# Patient Record
Sex: Female | Born: 1990 | Race: Black or African American | Hispanic: No | Marital: Single | Smoking: Never smoker
Health system: Southern US, Community
[De-identification: ages and names within clinical notes are randomized; demographics above are authoritative.]

## PROBLEM LIST (undated history)

## (undated) ENCOUNTER — Inpatient Hospital Stay (HOSPITAL_COMMUNITY): Payer: Self-pay

## (undated) ENCOUNTER — Inpatient Hospital Stay (HOSPITAL_COMMUNITY): Payer: BC Managed Care – PPO

## (undated) ENCOUNTER — Ambulatory Visit (HOSPITAL_COMMUNITY): Admission: EM | Payer: Medicaid Other

## (undated) DIAGNOSIS — Z8619 Personal history of other infectious and parasitic diseases: Secondary | ICD-10-CM

## (undated) DIAGNOSIS — F419 Anxiety disorder, unspecified: Secondary | ICD-10-CM

## (undated) DIAGNOSIS — Z789 Other specified health status: Secondary | ICD-10-CM

## (undated) HISTORY — PX: NO PAST SURGERIES: SHX2092

---
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
## 2012-03-27 ENCOUNTER — Ambulatory Visit: Payer: BC Managed Care – PPO | Admitting: Family Medicine

## 2012-04-11 ENCOUNTER — Ambulatory Visit: Payer: BC Managed Care – PPO

## 2012-04-14 ENCOUNTER — Encounter: Payer: Self-pay | Admitting: Family Medicine

## 2012-04-14 ENCOUNTER — Ambulatory Visit (INDEPENDENT_AMBULATORY_CARE_PROVIDER_SITE_OTHER): Payer: BC Managed Care – PPO | Admitting: Family Medicine

## 2012-04-14 VITALS — BP 118/76 | HR 102 | Temp 97.7°F | Ht 66.0 in | Wt 156.7 lb

## 2012-04-14 DIAGNOSIS — Z111 Encounter for screening for respiratory tuberculosis: Secondary | ICD-10-CM

## 2012-04-14 DIAGNOSIS — Z3009 Encounter for other general counseling and advice on contraception: Secondary | ICD-10-CM

## 2012-04-14 NOTE — Patient Instructions (Addendum)
[email or web address]  No sex or use condoms to prevent pregnancy before you come to get it placed  May take 600 mg ibuprofen 1 hour before your appointment

## 2012-04-14 NOTE — Progress Notes (Signed)
  Subjective:    Patient ID: Natasha Garcia, female    DOB: [birth year], 22 y.o.   MRN: [id number]  HPI    Here to discuss contraception  Unplanned pregnancy was on OCP- delivered via home birth October 2013  Has had unprotected sex.  Does not desire pregnancy for several years.  Would like to discuss options for long term birth control Review of Systems See HPI    Objective:   Physical Exam  GEN: NAD      Assessment & Plan:

## 2012-04-14 NOTE — Assessment & Plan Note (Signed)
Discussed iud, nexplanon and depo as options.  Patient would l ike to proceed with Mirena.  Discussed risks, procedure with model in room.  Also discussed small chance of early pregnancy if not abstaining or using contraception.  She decided abstinence would be her best option, will plan on placing the Mirena at her next convenience.

## 2012-04-16 ENCOUNTER — Ambulatory Visit (INDEPENDENT_AMBULATORY_CARE_PROVIDER_SITE_OTHER): Payer: BC Managed Care – PPO | Admitting: *Deleted

## 2012-04-16 DIAGNOSIS — Z111 Encounter for screening for respiratory tuberculosis: Secondary | ICD-10-CM

## 2012-04-16 LAB — TB SKIN TEST: TB Skin Test: NEGATIVE

## 2012-06-27 ENCOUNTER — Ambulatory Visit: Payer: BC Managed Care – PPO | Admitting: Family Medicine

## 2012-07-08 ENCOUNTER — Ambulatory Visit: Payer: BC Managed Care – PPO | Admitting: Family Medicine

## 2012-07-15 ENCOUNTER — Ambulatory Visit (INDEPENDENT_AMBULATORY_CARE_PROVIDER_SITE_OTHER): Payer: BC Managed Care – PPO | Admitting: Family Medicine

## 2012-07-15 ENCOUNTER — Encounter: Payer: Self-pay | Admitting: Family Medicine

## 2012-07-15 VITALS — BP 120/70 | HR 90 | Temp 98.3°F | Ht 66.0 in | Wt 152.0 lb

## 2012-07-15 DIAGNOSIS — Z2089 Contact with and (suspected) exposure to other communicable diseases: Secondary | ICD-10-CM

## 2012-07-15 DIAGNOSIS — Z30430 Encounter for insertion of intrauterine contraceptive device: Secondary | ICD-10-CM

## 2012-07-15 DIAGNOSIS — Z309 Encounter for contraceptive management, unspecified: Secondary | ICD-10-CM

## 2012-07-15 DIAGNOSIS — Z20828 Contact with and (suspected) exposure to other viral communicable diseases: Secondary | ICD-10-CM

## 2012-07-15 DIAGNOSIS — Z202 Contact with and (suspected) exposure to infections with a predominantly sexual mode of transmission: Secondary | ICD-10-CM

## 2012-07-15 LAB — RPR

## 2012-07-15 LAB — HIV ANTIBODY (ROUTINE TESTING W REFLEX): HIV: NONREACTIVE

## 2012-07-15 MED ORDER — IBUPROFEN 200 MG PO TABS
800.0000 mg | ORAL_TABLET | Freq: Once | ORAL | Status: AC
  Administered 2012-07-15: 800 mg via ORAL

## 2012-07-15 NOTE — Progress Notes (Signed)
  Subjective:    Patient ID: Natasha Garcia, female    DOB: 02-12-1991, 22 y.o.   MRN: [id number]  HPIHere to have Mirena inserted.  Not quite sure if she would like mirena or nexplanon.  Discussed pros and cons of each.  Also desires HIV testing  LMP 06/28/12.  Has not had intercourse since that time.  upreg negative today.  No concerns for vaginal discharge, pelvic infection.  Review of Systems     Objective:   Physical Exam Placement of Mirena IUD  Indication: Contraception Assisted WU:JWJXB Kathrine Cords   Patient was counseled regarding the risks/benefits/alternatives of the IUD. Urine pregnancy test negative . Consent was obtained and all questions answered.  Time out performed.  Description: Cervix was swabbed three times with Betadine swabs. Sterile gloves donned. Sterile single-tooth tenaculum used to grasp anterior lip of the cervix and straighten the endocervical canal. Uterus sounded to 8 cm. IUD loaded per manufacturer's instruction and flange set to 8 cm. IUD placed per manufacturer's directions. Strings trimmed to 3 cm. Patient tolerated the procedure well.    Follow-up: Patient counseled regarding techniques for self-monitoring of IUD and given precautions. Patient notified of removal date and given card. Patient will follow-up in 4 weeks for string check.        Assessment & Plan:

## 2012-07-15 NOTE — Patient Instructions (Addendum)
IUD AFTERCARE  1.  Uterine cramping is common after IUD placement.  You  May using heating pads, ibuprofen, and tylenol.  If it becomes very painful, you notice fever or chills, unusual bleeding, or foul smelling vaginal discharge please call the office. 2.  Irregular vaginal bleeding is common in the first few months after IUD placement but will often get better in the first six months.   3.  IUDs do not protect against STD such as HIV, genital warts, gonorrhea, chlamydia, herpes.  Please continue to protect yourself against these infections and contact the office if you think you may have contracted an infection. 4.  Checking for proper placement:  You may feel for your IUD strings as shown today by placing you fingers into your vagina.  Do not pull on the strings.  If your Mirena falls out, you can feel the hard plastic part of the IUD, or you can no longer feel the strings, please contact the office. 5.  Your Mirena should be removed or replaced in 5 years. 6.  Please see package insert or [email or web address] for full patient information. 7.  Make follow-up appointment for 4 weeks.  Use backup protection for 1 week

## 2012-07-15 NOTE — Addendum Note (Signed)
Addended by: Jennette Bill on: 07/15/2012 05:28 PM   Modules accepted: Orders

## 2012-07-16 ENCOUNTER — Encounter: Payer: Self-pay | Admitting: Family Medicine

## 2012-07-16 MED ORDER — LEVONORGESTREL 20 MCG/24HR IU IUD: INTRAUTERINE_SYSTEM | Freq: Once | INTRAUTERINE | Status: DC

## 2012-07-16 MED ORDER — LEVONORGESTREL 20 MCG/24HR IU IUD
INTRAUTERINE_SYSTEM | Freq: Once | INTRAUTERINE | Status: AC
  Administered 2012-07-15: 17:00:00 via INTRAUTERINE

## 2012-07-16 NOTE — Addendum Note (Signed)
Addended by: Jone Baseman D on: 07/16/2012 08:47 AM   Modules accepted: Orders

## 2012-11-25 ENCOUNTER — Ambulatory Visit: Payer: BC Managed Care – PPO | Admitting: Family Medicine

## 2012-11-27 ENCOUNTER — Ambulatory Visit: Payer: BC Managed Care – PPO | Admitting: Family Medicine

## 2012-12-14 ENCOUNTER — Emergency Department (HOSPITAL_COMMUNITY)
Admission: EM | Admit: 2012-12-14 | Discharge: 2012-12-14 | Disposition: A | Discharge status: Home / Self Care | Payer: BC Managed Care – PPO | Source: Home / Self Care

## 2012-12-14 ENCOUNTER — Encounter (HOSPITAL_COMMUNITY): Payer: Self-pay

## 2012-12-14 DIAGNOSIS — B356 Tinea cruris: Secondary | ICD-10-CM

## 2012-12-14 MED ORDER — NYSTATIN 100000 UNIT/GM EX POWD: Freq: Four times a day (QID) | CUTANEOUS | Status: DC

## 2012-12-14 MED ORDER — CLOTRIMAZOLE-BETAMETHASONE 1-0.05 % EX CREA: TOPICAL_CREAM | CUTANEOUS | Status: DC

## 2012-12-14 NOTE — ED Notes (Signed)
C/o rash lower abdominal area and groin x 3 weeks; no one else in household has rash

## 2012-12-14 NOTE — ED Provider Notes (Signed)
CSN: [id number]     Arrival date & time 12/14/12  1337 History   None    Chief Complaint  Patient presents with  . Rash   (Consider location/radiation/quality/duration/timing/severity/associated sxs/prior Treatment) HPI Comments: 22 year old female complaining of a rash for one month. It is located on the proximal medial thighs and along the inguinal creases.   Past Medical History  Diagnosis Date  . No pertinent past medical history    Past Surgical History  Procedure Laterality Date  . No past surgeries     Family History  Problem Relation Age of Onset  . Cancer Maternal Grandmother   . Diabetes Neg Hx   . Heart disease Neg Hx   . Stroke Neg Hx   . Anesthesia problems Neg Hx    History  Substance Use Topics  . Smoking status: Never Smoker   . Smokeless tobacco: Never Used  . Alcohol Use: No   OB History   Grav Para Term Preterm Abortions TAB SAB Ect Mult Living   1              Review of Systems  Skin: Positive for rash.  All other systems reviewed and are negative.    Allergies  Review of patient's allergies indicates no known allergies.  Home Medications   Current Outpatient Rx  Name  Route  Sig  Dispense  Refill  . levonorgestrel (MIRENA) 20 MCG/24HR IUD   Intrauterine   1 each by Intrauterine route once. Inserted 07/15/12         . clotrimazole-betamethasone (LOTRISONE) cream      Apply to affected area 2 times daily prn   30 g   0   . nystatin (MYCOSTATIN) powder   Topical   Apply topically 4 (four) times daily.   30 g   0   . Prenatal Vit-Fe Fumarate-FA (PRENATAL MULTIVITAMIN) TABS   Oral   Take 1 tablet by mouth daily.          BP 108/80  Pulse 76  Temp(Src) 98.2 F (36.8 C) (Oral)  Resp 10  LMP 12/13/2012  Breastfeeding? Unknown Physical Exam  Nursing note and vitals reviewed. Constitutional: She is oriented to person, place, and time. She appears well-developed and well-nourished. No distress.  Eyes: EOM are normal.  Neck:  Normal range of motion. Neck supple.  Cardiovascular: Normal rate.   Pulmonary/Chest: Effort normal. No respiratory distress.  Musculoskeletal: She exhibits no edema.  Neurological: She is alert and oriented to person, place, and time.  Skin: Skin is warm and dry. Rash noted.  Slightly raised, avoid and partially scaly rough lesions along the bilateral inguinal folds and proximal medial thighs. When working sweating these areas burn. No drainage or evidence of bacterial infection.  Psychiatric: She has a normal mood and affect.    ED Course  Procedures (including critical care time) Labs Review Labs Reviewed - No data to display Imaging Review No results found.  MDM   1. Tinea cruris      Keep area dry as much as possible Nystatin powder 3-4 times a day Lotrisone cream twice a day to affected area.  Hayden Rasmussen, NP 12/14/12 1444

## 2012-12-14 NOTE — ED Provider Notes (Signed)
Medical screening examination/treatment/procedure(s) were performed by resident physician or non-physician practitioner and as supervising physician I was immediately available for consultation/collaboration.   Barkley Bruns MD.   Linna Hoff, MD 12/14/12 [phone]

## 2012-12-26 ENCOUNTER — Telehealth (HOSPITAL_COMMUNITY): Payer: Self-pay | Admitting: Emergency Medicine

## 2012-12-26 NOTE — ED Notes (Signed)
Patient reports running out of cream, spoke to [employer], np.  Instructed patient to use lotrimin or lamisil otc.  Patient states understanding

## 2013-01-01 ENCOUNTER — Ambulatory Visit (INDEPENDENT_AMBULATORY_CARE_PROVIDER_SITE_OTHER): Payer: BC Managed Care – PPO | Admitting: Family Medicine

## 2013-01-01 VITALS — BP 129/83 | HR 80 | Temp 98.8°F | Wt 134.0 lb

## 2013-01-01 DIAGNOSIS — L210 Seborrhea capitis: Secondary | ICD-10-CM | POA: Insufficient documentation

## 2013-01-01 DIAGNOSIS — R21 Rash and other nonspecific skin eruption: Secondary | ICD-10-CM

## 2013-01-01 MED ORDER — TRIAMCINOLONE ACETONIDE 0.1 % EX CREA: TOPICAL_CREAM | Freq: Two times a day (BID) | CUTANEOUS | Status: DC

## 2013-01-01 MED ORDER — PREDNISONE 50 MG PO TABS: 50.0000 mg | ORAL_TABLET | Freq: Every day | ORAL | Status: DC

## 2013-01-01 NOTE — Assessment & Plan Note (Signed)
Patient seen and examined by Dr. Mauricio Po. KOH skin scraping negative for fungus. D/c antifungals. Distribution and story very consistent with pityriasis. Will treat with Prednisone 50mg  x5 days for itching and redness. Use Triamcinolone prn for itching as well. Expect it to resolve spontaneously, f/u as needed.

## 2013-01-01 NOTE — Patient Instructions (Addendum)

## 2013-01-01 NOTE — Progress Notes (Signed)
Patient ID: Natasha Garcia, female   DOB: 01-10-1991, 22 y.o.   MRN: [id number]  [facility] Amber M. Hairford, MD Phone: [phone]   Subjective: HPI: Patient is a 22 y.o. female presenting to clinic today for same day follow up appointment for skin rash.   She was seen at Urgent Care and diagnosed with fungal infection. She used topical cream and powder which helped some initially, but once she ran out of the cream, the rash became more red and itchy again. She states it never fully went away and is now getting worse/spreading. Initially she had 2-3 areas on her arms, then she noticed the rash in her groin/thighs. She states that has been there for 1 month. It is red, peeling and itchy. She has recently noticed more red areas on her back and upper chest as well. She is not using any new medications, soaps, lotions, detergents.   History Reviewed: Non smoker. Health Maintenance: UTD  ROS: Please see HPI above.  Objective: Office vital signs reviewed. BP 129/83  Pulse 80  Temp(Src) 98.8 F (37.1 C) (Oral)  Wt 134 lb (60.782 kg)  BMI 21.64 kg/m2  LMP 12/13/2012  Physical Examination:  General: Awake, alert. NAD HEENT: Atraumatic, normocephalic. MMM. Abdomen:+BS, soft, nontender, nondistended Extremities: No edema Skin: hyperpigmented discrete lesions on thighs, inguinal folds and upper thighs. Some more erythematous than others, and some with dry central patches. Also has discrete erythematous macules on back.  Assessment: 22 y.o. female with skin rash, most likely pityriasis   Plan: See Problem List and After Visit Summary

## 2013-03-23 ENCOUNTER — Telehealth: Payer: Self-pay | Admitting: Family Medicine

## 2013-03-23 ENCOUNTER — Other Ambulatory Visit: Payer: Self-pay | Admitting: Family Medicine

## 2013-03-23 MED ORDER — TRIAMCINOLONE ACETONIDE 0.1 % EX CREA: TOPICAL_CREAM | Freq: Two times a day (BID) | CUTANEOUS | Status: DC

## 2013-03-23 NOTE — Telephone Encounter (Signed)
Pt is calling for a refill on her kenalog sent to her pharmacy. jw

## 2013-03-23 NOTE — Telephone Encounter (Signed)
Refill sent in

## 2013-05-22 ENCOUNTER — Telehealth: Payer: Self-pay | Admitting: Family Medicine

## 2013-05-22 MED ORDER — TRIAMCINOLONE ACETONIDE 0.1 % EX CREA: TOPICAL_CREAM | Freq: Two times a day (BID) | CUTANEOUS | Status: DC

## 2013-05-22 NOTE — Telephone Encounter (Signed)
Refill request for Kenalog. Patient states she would like a larger tube or multiple tubes of cream called in because she goes through it rather fast. [phone]

## 2013-07-04 ENCOUNTER — Encounter (HOSPITAL_COMMUNITY): Payer: Self-pay | Admitting: Emergency Medicine

## 2013-07-04 ENCOUNTER — Emergency Department (HOSPITAL_COMMUNITY)
Admission: EM | Admit: 2013-07-04 | Discharge: 2013-07-04 | Disposition: A | Discharge status: Home / Self Care | Payer: BC Managed Care – PPO | Source: Home / Self Care | Attending: Emergency Medicine | Admitting: Emergency Medicine

## 2013-07-04 DIAGNOSIS — K529 Noninfective gastroenteritis and colitis, unspecified: Secondary | ICD-10-CM

## 2013-07-04 DIAGNOSIS — K5289 Other specified noninfective gastroenteritis and colitis: Secondary | ICD-10-CM

## 2013-07-04 LAB — POCT URINALYSIS DIP (DEVICE)
BILIRUBIN URINE: NEGATIVE
Glucose, UA: NEGATIVE mg/dL
Hgb urine dipstick: NEGATIVE
KETONES UR: NEGATIVE mg/dL
Leukocytes, UA: NEGATIVE
Nitrite: NEGATIVE
PH: 7 (ref 5.0–8.0)
Protein, ur: NEGATIVE mg/dL
Specific Gravity, Urine: 1.025 (ref 1.005–1.030)
Urobilinogen, UA: 0.2 mg/dL (ref 0.0–1.0)

## 2013-07-04 LAB — POCT PREGNANCY, URINE: PREG TEST UR: NEGATIVE

## 2013-07-04 MED ORDER — ONDANSETRON 4 MG PO TBDP
4.0000 mg | ORAL_TABLET | Freq: Once | ORAL | Status: AC
  Administered 2013-07-04: 4 mg via ORAL

## 2013-07-04 MED ORDER — ONDANSETRON HCL 4 MG PO TABS: 4.0000 mg | ORAL_TABLET | Freq: Three times a day (TID) | ORAL | Status: DC | PRN

## 2013-07-04 MED ORDER — ONDANSETRON 4 MG PO TBDP
ORAL_TABLET | ORAL | Status: AC
  Filled 2013-07-04: qty 1

## 2013-07-04 NOTE — ED Notes (Signed)
C/o abd pain  States she thinks she has a stomach virus due to vomiting, chills and diarrhea Has vomit 8 times today and diarrhea once today

## 2013-07-04 NOTE — ED Provider Notes (Signed)
Medical screening examination/treatment/procedure(s) were performed by non-physician practitioner and as supervising physician I was immediately available for consultation/collaboration.  Leslee Homeavid Donell Tomkins, M.D.  Reuben Likesavid C Makesha Belitz, MD 07/04/13 2149

## 2013-07-04 NOTE — ED Provider Notes (Signed)
CSN: [id number]     Arrival date & time 07/04/13  1819 History   First MD Initiated Contact with Patient 07/04/13 1918     Chief Complaint  Patient presents with  . Abdominal Pain   (Consider location/radiation/quality/duration/timing/severity/associated sxs/prior Treatment) HPI Comments: Non-bloody emesis, non-bloody diarrhea  Patient is a 23 y.o. female presenting with vomiting.  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 No pertinent past medical history    Past Surgical History  Procedure Laterality Date  . No past surgeries     Family History  Problem Relation Age of Onset  . Cancer Maternal Grandmother   . Diabetes Neg Hx   . Heart disease Neg Hx   . Stroke Neg Hx   . Anesthesia problems Neg Hx    History  Substance Use Topics  . Smoking status: Never Smoker   . Smokeless tobacco: Never Used  . Alcohol Use: No   OB History   Grav Para Term Preterm Abortions TAB SAB Ect Mult Living   1              Review of Systems  Constitutional: Negative for fever, chills and fatigue.  HENT: Negative.  Negative for sore throat.   Eyes: Negative.   Respiratory: Negative.   Cardiovascular: Negative.   Gastrointestinal: Positive for nausea, vomiting and diarrhea. Negative for abdominal pain.  Endocrine: Negative for polydipsia, polyphagia and polyuria.  Genitourinary: Negative.   Musculoskeletal: Negative for arthralgias and myalgias.  Skin: Negative.   Neurological: Negative for  dizziness and headaches.    Allergies  Review of patient's allergies indicates no known allergies.  Home Medications   Current Outpatient Rx  Name  Route  Sig  Dispense  Refill  . levonorgestrel (MIRENA) 20 MCG/24HR IUD   Intrauterine   1 each by Intrauterine route once. Inserted 07/15/12         . ondansetron (ZOFRAN) 4 MG tablet   Oral   Take 1 tablet (4 mg total) by mouth every 8 (eight) hours as needed for nausea or vomiting.   15 tablet   0   . predniSONE (DELTASONE) 50 MG tablet   Oral   Take 1 tablet (50 mg total) by mouth daily.   5 tablet   0   . Prenatal Vit-Fe Fumarate-FA (PRENATAL MULTIVITAMIN) TABS   Oral   Take 1 tablet by mouth daily.         Marland Kitchen triamcinolone cream (KENALOG) 0.1 %   Topical   Apply topically 2 (two) times daily.   2268 g   1    BP 125/77  Pulse 88  Temp(Src) 98.1 F (36.7 C) (Oral)  Resp 19  SpO2 98%  LMP 05/24/2013 Physical Exam  Nursing note and vitals reviewed. Constitutional: She is oriented to person, place, and time. She appears well-developed and well-nourished.  HENT:  Head: Normocephalic and atraumatic.  Eyes: Conjunctivae are normal. No scleral icterus.  Cardiovascular: Normal rate, regular rhythm and normal heart sounds.   Pulmonary/Chest:  Effort normal and breath sounds normal.  Abdominal: Soft. Bowel sounds are normal. She exhibits no distension and no mass. There is no tenderness. There is no rebound and no guarding.  Musculoskeletal: Normal range of motion.  Neurological: She is alert and oriented to person, place, and time.  Skin: Skin is warm and dry. No rash noted.  Psychiatric: She has a normal mood and affect. Her behavior is normal.    ED Course  Procedures (including critical care time) Labs Review Labs Reviewed  POCT URINALYSIS DIP (DEVICE)  POCT PREGNANCY, URINE   Imaging Review No results found.   MDM   1. Gastroenteritis    Zofran as prescribed. Clear liquid diet for the next 8-12  hours. Then advance diet as tolerated. Red flag reasons for return reviewed with patient. Tylenol or ibuprofen as directed on packing for mild abdominal cramping. UA normal. UPT negative.   Jess BartersJennifer Lee SmithlandPresson, [place]PA 07/04/13 2031

## 2013-10-24 ENCOUNTER — Encounter (HOSPITAL_COMMUNITY): Payer: Self-pay | Admitting: Emergency Medicine

## 2013-10-24 ENCOUNTER — Emergency Department (HOSPITAL_COMMUNITY)
Admission: EM | Admit: 2013-10-24 | Discharge: 2013-10-24 | Disposition: A | Discharge status: Home / Self Care | Payer: BC Managed Care – PPO | Source: Home / Self Care | Attending: Emergency Medicine | Admitting: Emergency Medicine

## 2013-10-24 DIAGNOSIS — R599 Enlarged lymph nodes, unspecified: Secondary | ICD-10-CM

## 2013-10-24 DIAGNOSIS — R590 Localized enlarged lymph nodes: Secondary | ICD-10-CM

## 2013-10-24 DIAGNOSIS — R112 Nausea with vomiting, unspecified: Secondary | ICD-10-CM

## 2013-10-24 DIAGNOSIS — F1010 Alcohol abuse, uncomplicated: Secondary | ICD-10-CM

## 2013-10-24 DIAGNOSIS — M549 Dorsalgia, unspecified: Secondary | ICD-10-CM

## 2013-10-24 LAB — POCT URINALYSIS DIP (DEVICE)
Bilirubin Urine: NEGATIVE
Glucose, UA: NEGATIVE mg/dL
Hgb urine dipstick: NEGATIVE
KETONES UR: NEGATIVE mg/dL
LEUKOCYTES UA: NEGATIVE
Nitrite: NEGATIVE
PH: 7 (ref 5.0–8.0)
Protein, ur: 30 mg/dL — AB
Specific Gravity, Urine: 1.02 (ref 1.005–1.030)
Urobilinogen, UA: 0.2 mg/dL (ref 0.0–1.0)

## 2013-10-24 LAB — POCT PREGNANCY, URINE: Preg Test, Ur: NEGATIVE

## 2013-10-24 NOTE — ED Notes (Signed)
Pt reports getting drunk last night; has been vomiting today - had to leave work early, needs a note.  Also "while I'm here" c/o "lump" to right side base of neck, and ongoing back pain for several months that is relieved by 800mg  Motrin or 1g Tyl.

## 2013-10-24 NOTE — ED Provider Notes (Signed)
CSN: [id number]     Arrival date & time 10/24/13  1718 History   First MD Initiated Contact with Patient 10/24/13 1759     Chief Complaint  Patient presents with  . Emesis   (Consider location/radiation/quality/duration/timing/severity/associated sxs/prior Treatment) HPI Comments: 23 year old female presents requesting a note that she may return to work. She went out last night and got extremely drunk and was vomiting at work today so she was sent home. She is done vomiting, she feels okay now. No more nausea. She is going down liquids without difficulty. No abdominal pain. Also she reports a small lump in the right side of her neck, she states that she had really rough sex last night and wonders if that could have caused the lump in her neck. Also she complains of occasional back pain after a long day of work in her lower back. Right now, she is asymptomatic except for a slight headache from a hangover.  Patient is a 23 y.o. female presenting with vomiting.  Emesis Associated symptoms: no abdominal pain, no chills and no diarrhea     Past Medical History  Diagnosis Date  . No pertinent past medical history    Past Surgical History  Procedure Laterality Date  . No past surgeries     Family History  Problem Relation Age of Onset  . Cancer Maternal Grandmother   . Diabetes Neg Hx   . Heart disease Neg Hx   . Stroke Neg Hx   . Anesthesia problems Neg Hx    History  Substance Use Topics  . Smoking status: Never Smoker   . Smokeless tobacco: Never Used  . Alcohol Use: No     Comment: not normally   OB History   Grav Para Term Preterm Abortions TAB SAB Ect Mult Living   1              Review of Systems  Constitutional: Negative for fever and chills.  Gastrointestinal: Positive for nausea and vomiting. Negative for abdominal pain, diarrhea and constipation.  Musculoskeletal: Positive for back pain.  Hematological: Positive for adenopathy.  All other systems reviewed and are  negative.   Allergies  Review of patient's allergies indicates no known allergies.  Home Medications   Prior to Admission medications   Medication Sig Start Date End Date Taking? Authorizing Provider  levonorgestrel (MIRENA) 20 MCG/24HR IUD 1 each by Intrauterine route once. Inserted 07/15/12   Yes Historical Provider, MD  ondansetron (ZOFRAN) 4 MG tablet Take 1 tablet (4 mg total) by mouth every 8 (eight) hours as needed for nausea or vomiting. 07/04/13   Ardis RowanJennifer Lee Presson, PA  predniSONE (DELTASONE) 50 MG tablet Take 1 tablet (50 mg total) by mouth daily. 01/01/13   Amber Nydia BoutonM Hairford, MD  Prenatal Vit-Fe Fumarate-FA (PRENATAL MULTIVITAMIN) TABS Take 1 tablet by mouth daily.    Historical Provider, MD  triamcinolone cream (KENALOG) 0.1 % Apply topically 2 (two) times daily. 05/22/13   Josalyn C Funches, MD   BP 98/68  Pulse 80  Temp(Src) 99.4 F (37.4 C) (Oral)  Resp 16  SpO2 100% Physical Exam  Nursing note and vitals reviewed. Constitutional: She is oriented to person, place, and time. Vital signs are normal. She appears well-developed and well-nourished. No distress.  HENT:  Head: Normocephalic and atraumatic.  Pulmonary/Chest: Effort normal. No respiratory distress.  Abdominal: Soft. There is no tenderness.  Lymphadenopathy:    She has cervical adenopathy (right superficial cervical adenopathy, shotty, nontender).  Neurological: She is  alert and oriented to person, place, and time. She has normal strength. Coordination normal.  Skin: Skin is warm and dry. No rash noted. She is not diaphoretic.  Psychiatric: She has a normal mood and affect. Judgment normal.    ED Course  Procedures (including critical care time) Labs Review Labs Reviewed  POCT URINALYSIS DIP (DEVICE) - Abnormal; Notable for the following:    Protein, ur 30 (*)    All other components within normal limits  POCT PREGNANCY, URINE    Imaging Review No results found.   MDM   1. Non-intractable  vomiting with nausea, vomiting of unspecified type   2. Alcohol abuse   3. Cervical lymphadenopathy   4. Back pain, unspecified location    She declines any treatment, she just wants a note to say that she may return to work tomorrow. Work note provided. She declines a nausea medicine. No treatment required for the single lymph node, watchful waiting at this point. Followup as needed    Graylon Good, PA-C 10/24/13 1816

## 2013-10-24 NOTE — Discharge Instructions (Signed)
Alcohol Problems °Most adults who drink alcohol drink in moderation (not a lot) are at low risk for developing problems related to their drinking. However, all drinkers, including low-risk drinkers, should know about the health risks connected with drinking alcohol. °RECOMMENDATIONS FOR LOW-RISK DRINKING  °Drink in moderation. Moderate drinking is defined as follows:  °· Men - no more than 2 drinks per day. °· Nonpregnant women - no more than 1 drink per day. °· Over age 65 - no more than 1 drink per day. °A standard drink is 12 grams of pure alcohol, which is equal to a 12 ounce bottle of beer or wine cooler, a 5 ounce glass of wine, or 1.5 ounces of distilled spirits (such as whiskey, brandy, vodka, or rum).  °ABSTAIN FROM (DO NOT DRINK) ALCOHOL: °· When pregnant or considering pregnancy. °· When taking a medication that interacts with alcohol. °· If you are alcohol dependent. °· A medical condition that prohibits drinking alcohol (such as ulcer, liver disease, or heart disease). °DISCUSS WITH YOUR CAREGIVER: °· If you are at risk for coronary heart disease, discuss the potential benefits and risks of alcohol use: Light to moderate drinking is associated with lower rates of coronary heart disease in certain populations (for example, men over age 45 and postmenopausal women). Infrequent or nondrinkers are advised not to begin light to moderate drinking to reduce the risk of coronary heart disease so as to avoid creating an alcohol-related problem. Similar protective effects can likely be gained through proper diet and exercise. °· Women and the elderly have smaller amounts of body water than men. As a result women and the elderly achieve a higher blood alcohol concentration after drinking the same amount of alcohol. °· Exposing a fetus to alcohol can cause a broad range of birth defects referred to as Fetal Alcohol Syndrome (FAS) or Alcohol-Related Birth Defects (ARBD). Although FAS/ARBD is connected with excessive  alcohol consumption during pregnancy, studies also have reported neurobehavioral problems in infants born to mothers reporting drinking an average of 1 drink per day during pregnancy. °· Heavier drinking (the consumption of more than 4 drinks per occasion by men and more than 3 drinks per occasion by women) impairs learning (cognitive) and psychomotor functions and increases the risk of alcohol-related problems, including accidents and injuries. °CAGE QUESTIONS:  °· Have you ever felt that you should Cut down on your drinking? °· Have people Annoyed you by criticizing your drinking? °· Have you ever felt bad or Guilty about your drinking? °· Have you ever had a drink first thing in the morning to steady your nerves or get rid of a hangover (Eye opener)? °If you answered positively to any of these questions: You may be at risk for alcohol-related problems if alcohol consumption is:  °· Men: Greater than 14 drinks per week or more than 4 drinks per occasion. °· Women: Greater than 7 drinks per week or more than 3 drinks per occasion. °Do you or your family have a medical history of alcohol-related problems, such as: °· Blackouts. °· Sexual dysfunction. °· Depression. °· Trauma. °· Liver dysfunction. °· Sleep disorders. °· Hypertension. °· Chronic abdominal pain. °· Has your drinking ever caused you problems, such as problems with your family, problems with your work (or school) performance, or accidents/injuries? °· Do you have a compulsion to drink or a preoccupation with drinking? °· Do you have poor control or are you unable to stop drinking once you have started? °· Do you have to drink to   avoid withdrawal symptoms?  Do you have problems with withdrawal such as tremors, nausea, sweats, or mood disturbances?  Does it take more alcohol than in the past to get you high?  Do you feel a strong urge to drink?  Do you change your plans so that you can have a drink?  Do you ever drink in the morning to relieve  the shakes or a hangover? If you have answered a number of the previous questions positively, it may be time for you to talk to your caregivers, family, and friends and see if they think you have a problem. Alcoholism is a chemical dependency that keeps getting worse and will eventually destroy your health and relationships. Many alcoholics end up dead, impoverished, or in prison. This is often the end result of all chemical dependency.  Do not be discouraged if you are not ready to take action immediately.  Decisions to change behavior often involve up and down desires to change and feeling like you cannot decide.  Try to think more seriously about your drinking behavior.  Think of the reasons to quit. WHERE TO GO FOR ADDITIONAL INFORMATION   The National Institute on Alcohol Abuse and Alcoholism (NIAAA) BasicStudents.dkwww.niaaa.nih.gov  [employer] on Alcoholism and Drug Dependence (NCADD) www.ncadd.org  American Society of Addiction Medicine (ASAM) RoyalDiary.glwww.asam.org  Document Released: 03/26/2005 Document Revised: 06/18/2011 Document Reviewed: 11/12/2007 [facility] Patient Information 2015 [employer]. This information is not intended to replace advice given to you by your health care provider. Make sure you discuss any questions you have with your health care provider.   Swollen Lymph Nodes The lymphatic system filters fluid from around cells. It is like a system of blood vessels. These channels carry lymph instead of blood. The lymphatic system is an important part of the immune (disease fighting) system. When people talk about "swollen glands in the neck," they are usually talking about swollen lymph nodes. The lymph nodes are like the little traps for infection. You and your caregiver may be able to feel lymph nodes, especially swollen nodes, in these common areas: the groin (inguinal area), armpits (axilla), and above the clavicle (supraclavicular). You may also feel them in the neck (cervical)  and the back of the head just above the hairline (occipital). Swollen glands occur when there is any condition in which the body responds with an allergic type of reaction. For instance, the glands in the neck can become swollen from insect bites or any type of minor infection on the head. These are very noticeable in children with only minor problems. Lymph nodes may also become swollen when there is a tumor or problem with the lymphatic system, such as Hodgkin's disease. TREATMENT   Most swollen glands do not require treatment. They can be observed (watched) for a short period of time, if your caregiver feels it is necessary. Most of the time, observation is not necessary.  Antibiotics (medicines that kill germs) may be prescribed by your caregiver. Your caregiver may prescribe these if he or she feels the swollen glands are due to a bacterial (germ) infection. Antibiotics are not used if the swollen glands are caused by a virus. HOME CARE INSTRUCTIONS   Take medications as directed by your caregiver. Only take over-the-counter or prescription medicines for pain, discomfort, or fever as directed by your caregiver. SEEK MEDICAL CARE IF:   If you begin to run a temperature greater than 102 F (38.9 C), or as your caregiver suggests. MAKE SURE YOU:   Understand  these instructions.  Will watch your condition.  Will get help right away if you are not doing well or get worse. Document Released: 03/16/2002 Document Revised: 06/18/2011 Document Reviewed: 03/26/2005 [facility] Patient Information 2015 [place], [place]. This information is not intended to replace advice given to you by your health care provider. Make sure you discuss any questions you have with your health care provider.

## 2013-10-25 NOTE — ED Provider Notes (Signed)
Medical screening examination/treatment/procedure(s) were performed by non-physician practitioner and as supervising physician I was immediately available for consultation/collaboration.  Leslee Homeavid Rasheida Broden, M.D.  Reuben Likesavid C Shequita Peplinski, MD 10/25/13 [phone]

## 2014-01-18 ENCOUNTER — Ambulatory Visit (INDEPENDENT_AMBULATORY_CARE_PROVIDER_SITE_OTHER): Payer: BC Managed Care – PPO | Admitting: Family Medicine

## 2014-01-18 ENCOUNTER — Ambulatory Visit (HOSPITAL_COMMUNITY)
Admission: RE | Admit: 2014-01-18 | Discharge: 2014-01-18 | Disposition: A | Discharge status: Home / Self Care | Payer: BC Managed Care – PPO | Source: Ambulatory Visit | Attending: Cardiology | Admitting: Cardiology

## 2014-01-18 ENCOUNTER — Encounter: Payer: Self-pay | Admitting: Family Medicine

## 2014-01-18 VITALS — BP 117/78 | HR 113 | Temp 98.2°F | Resp 18 | Wt 130.0 lb

## 2014-01-18 DIAGNOSIS — J111 Influenza due to unidentified influenza virus with other respiratory manifestations: Secondary | ICD-10-CM | POA: Insufficient documentation

## 2014-01-18 DIAGNOSIS — M791 Myalgia, unspecified site: Secondary | ICD-10-CM

## 2014-01-18 DIAGNOSIS — J1189 Influenza due to unidentified influenza virus with other manifestations: Secondary | ICD-10-CM

## 2014-01-18 DIAGNOSIS — I471 Supraventricular tachycardia, unspecified: Secondary | ICD-10-CM | POA: Insufficient documentation

## 2014-01-18 MED ORDER — NAPROXEN 500 MG PO TABS: 500.0000 mg | ORAL_TABLET | Freq: Two times a day (BID) | ORAL | Status: DC

## 2014-01-18 MED ORDER — OSELTAMIVIR PHOSPHATE 75 MG PO CAPS: 75.0000 mg | ORAL_CAPSULE | Freq: Two times a day (BID) | ORAL | Status: DC

## 2014-01-18 NOTE — Assessment & Plan Note (Signed)
High concern for flu given presentation and employment Cannot test for flu here Tamiflu X5 days Naproxen REst/relaxation Work note Sx control

## 2014-01-18 NOTE — Assessment & Plan Note (Signed)
Resolved on EKG Potentially also had some PVCs (not captured) Will hold on further testing at this time [facility]

## 2014-01-18 NOTE — Progress Notes (Signed)
Patient ID: Inge RiseAlexis M Breach, female   DOB: 08/24/1990, 23 y.o.   MRN: [id number]   [facility] Charlane FerrettiMelanie C Greer Wainright, MD Phone: [phone]  Subjective:  Pt is a 23 y.o F who presents for URI sx  # Cold  -smoked THC prior to coming in? -sx since yesterday with cough -not interested in going to work -denies heavy breathing, chest pain, SOB -c/o cold sx since being at work-> (works at nursing home); fatigue, mm aches, sneezing, watering eyes -tried to take claritin yesterday but did not help  -denies dizziness, decreased PO intake   All relevant systems were reviewed and were negative unless otherwise noted in the HPI  Past Medical History Reviewed problem list.  Medications- reviewed and updated Current Outpatient Prescriptions  Medication Sig Dispense Refill  . levonorgestrel (MIRENA) 20 MCG/24HR IUD 1 each by Intrauterine route once. Inserted 07/15/12      . ondansetron (ZOFRAN) 4 MG tablet Take 1 tablet (4 mg total) by mouth every 8 (eight) hours as needed for nausea or vomiting.  15 tablet  0  . predniSONE (DELTASONE) 50 MG tablet Take 1 tablet (50 mg total) by mouth daily.  5 tablet  0  . Prenatal Vit-Fe Fumarate-FA (PRENATAL MULTIVITAMIN) TABS Take 1 tablet by mouth daily.      Marland Kitchen. triamcinolone cream (KENALOG) 0.1 % Apply topically 2 (two) times daily.  2268 g  1   No current facility-administered medications for this visit.   Chief complaint-noted No additions to family history Social history- patient is a current smoker of THC  Objective: BP 117/78  Pulse 113  Temp(Src) 98.2 F (36.8 C) (Oral)  Resp 18  Wt 130 lb (58.968 kg)  SpO2 98% Gen: NAD, alert, cooperative with exam HEENT: NCAT, EOMI, PERRL, TMs nml, pharyngeal erythema  Neck: FROM, supple CV: reg rhythm with occ PVCs good S1/S2, no murmur, cap refill <3 Resp: CTABL, no wheezes, non-labored Abd: SNTND, BS present, no guarding or organomegaly Ext: No edema, warm, normal tone, moves UE/LE  spontaneously Neuro: Alert and oriented, No gross deficits Skin: no rashes no lesions  Assessment/Plan: See problem based a/p

## 2014-01-18 NOTE — Patient Instructions (Signed)
Ms Natasha Garcia it was great to meet you today!  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

## 2014-02-08 ENCOUNTER — Encounter: Payer: Self-pay | Admitting: Family Medicine

## 2014-04-19 ENCOUNTER — Other Ambulatory Visit (HOSPITAL_COMMUNITY)
Admission: RE | Admit: 2014-04-19 | Discharge: 2014-04-19 | Disposition: A | Discharge status: Home / Self Care | Payer: BLUE CROSS/BLUE SHIELD | Source: Ambulatory Visit | Attending: Family Medicine | Admitting: Family Medicine

## 2014-04-19 ENCOUNTER — Ambulatory Visit (INDEPENDENT_AMBULATORY_CARE_PROVIDER_SITE_OTHER): Payer: BLUE CROSS/BLUE SHIELD | Admitting: *Deleted

## 2014-04-19 ENCOUNTER — Ambulatory Visit (INDEPENDENT_AMBULATORY_CARE_PROVIDER_SITE_OTHER): Payer: BLUE CROSS/BLUE SHIELD | Admitting: Family Medicine

## 2014-04-19 VITALS — BP 124/85 | HR 89 | Temp 97.9°F | Ht 66.0 in | Wt 128.0 lb

## 2014-04-19 DIAGNOSIS — Z124 Encounter for screening for malignant neoplasm of cervix: Secondary | ICD-10-CM | POA: Diagnosis not present

## 2014-04-19 DIAGNOSIS — Z111 Encounter for screening for respiratory tuberculosis: Secondary | ICD-10-CM | POA: Diagnosis not present

## 2014-04-19 DIAGNOSIS — Z113 Encounter for screening for infections with a predominantly sexual mode of transmission: Secondary | ICD-10-CM | POA: Diagnosis present

## 2014-04-19 DIAGNOSIS — R87810 Cervical high risk human papillomavirus (HPV) DNA test positive: Secondary | ICD-10-CM | POA: Insufficient documentation

## 2014-04-19 DIAGNOSIS — Z202 Contact with and (suspected) exposure to infections with a predominantly sexual mode of transmission: Secondary | ICD-10-CM | POA: Diagnosis not present

## 2014-04-19 DIAGNOSIS — T148 Other injury of unspecified body region: Secondary | ICD-10-CM

## 2014-04-19 DIAGNOSIS — Z1151 Encounter for screening for human papillomavirus (HPV): Secondary | ICD-10-CM | POA: Diagnosis present

## 2014-04-19 DIAGNOSIS — W57XXXA Bitten or stung by nonvenomous insect and other nonvenomous arthropods, initial encounter: Secondary | ICD-10-CM

## 2014-04-19 DIAGNOSIS — Z01419 Encounter for gynecological examination (general) (routine) without abnormal findings: Secondary | ICD-10-CM | POA: Insufficient documentation

## 2014-04-19 LAB — POCT WET PREP (WET MOUNT): Clue Cells Wet Prep Whiff POC: POSITIVE

## 2014-04-19 MED ORDER — HYDROCORTISONE 0.5 % EX CREA: 1.0000 "application " | TOPICAL_CREAM | Freq: Two times a day (BID) | CUTANEOUS | Status: DC

## 2014-04-19 NOTE — Progress Notes (Signed)
   Subjective:    Patient ID: Natasha Garcia, female    DOB: 12/25/1990, 24 y.o.   MRN: [id number]  HPI  Possible Exposure to STD: Patient presents to family medicine clinic, same-day appointment for "STD check." Patient denies any vaginal itching or irritation. She is in a monogamous relationship and uses condoms for protection. She has an IUD for birth control. She states that she has concerns for exposure to sexually transmitted disease.   Rash: Patient has her rash that she takes has started since her sister slept in her bed. She has concerns that she had scabies, causes her sister said that she had bugs. She states the bites on her arms and legs are itchy. No drainage.   Nonsmoker   Past Medical History  Diagnosis Date  . No pertinent past medical history    No Known Allergies   Review of Systems  per history of present illness    Objective:   Physical Exam BP 124/85 mmHg  Pulse 89  Temp(Src) 97.9 F (36.6 C) (Oral)  Wt 128 lb (58.06 kg) Gen: Pleasant, African-American female, no acute distress, non-toxic in appearance, well-developed, well-nourished, alert, oriented 3 Abd: Soft. Flat. NTND. BS present. No Masses palpated.  Skin: Multiple small insect bites left arm right leg and back.  GYN:  External genitalia within normal limits.  Vaginal mucosa pink, moist, normal rugae. Friable cervix without lesions,  IUD strings identified. Mild white discharge, mild cervical bleeding noted on speculum exam.  Bimanual exam revealed normal, nongravid uterus.  No cervical motion tenderness. No adnexal masses bilaterally.      Assessment & Plan:

## 2014-04-19 NOTE — Progress Notes (Signed)
   PPD placed Left Forearm.  Pt to return 04/21/2014 for reading.  Pt tolerated intradermal injection. [name], Maverick Dieudonne L, RN

## 2014-04-19 NOTE — Assessment & Plan Note (Signed)
HIV/RPR/gonorrhea and Chlamydia collected today, will call patient with results once they become available

## 2014-04-19 NOTE — Patient Instructions (Signed)
Safe Sex Safe sex is about reducing the risk of giving or getting a sexually transmitted disease (STD). STDs are spread through sexual contact involving the genitals, mouth, or rectum. Some STDs can be cured and others cannot. Safe sex can also prevent unintended pregnancies.  WHAT ARE SOME SAFE SEX PRACTICES?  Limit your sexual activity to only one partner who is having sex with only you.  Talk to your partner about his or her past partners, past STDs, and drug use.  Use a condom every time you have sexual intercourse. This includes vaginal, oral, and anal sexual activity. Both females and males should wear condoms during oral sex. Only use latex or polyurethane condoms and water-based lubricants. Using petroleum-based lubricants or oils to lubricate a condom will weaken the condom and increase the chance that it will break. The condom should be in place from the beginning to the end of sexual activity. Wearing a condom reduces, but does not completely eliminate, your risk of getting or giving an STD. STDs can be spread by contact with infected body fluids and skin.  Get vaccinated for hepatitis B and HPV.  Avoid alcohol and recreational drugs, which can affect your judgment. You may forget to use a condom or participate in high-risk sex.  For females, avoid douching after sexual intercourse. Douching can spread an infection farther into the reproductive tract.  Check your body for signs of sores, blisters, rashes, or unusual discharge. See your health care provider if you notice any of these signs.  Avoid sexual contact if you have symptoms of an infection or are being treated for an STD. If you or your partner has herpes, avoid sexual contact when blisters are present. Use condoms at all other times.  If you are at risk of being infected with HIV, it is recommended that you take a prescription medicine daily to prevent HIV infection. This is called pre-exposure prophylaxis (PrEP). You are  considered at risk if:  You are a man who has sex with other men (MSM).  You are a heterosexual man or woman who is sexually active with more than one partner.  You take drugs by injection.  You are sexually active with a partner who has HIV.  Talk with your health care provider about whether you are at high risk of being infected with HIV. If you choose to begin PrEP, you should first be tested for HIV. You should then be tested every 3 months for as long as you are taking PrEP.  See your health care provider for regular screenings, exams, and tests for other STDs. Before having sex with a new partner, each of you should be screened for STDs and should talk about the results with each other. WHAT ARE THE BENEFITS OF SAFE SEX?   There is less chance of getting or giving an STD.  You can prevent unwanted or unintended pregnancies.  By discussing safe sex concerns with your partner, you may increase feelings of intimacy, comfort, trust, and honesty between the two of you. Document Released: 05/03/2004 Document Revised: 08/10/2013 Document Reviewed: 09/17/2011 [facility] Patient Information 2015 [place]. This information is not intended to replace advice given to you by your health care provider. Make sure you discuss any questions you have with your health care provider.  Bedbugs Bedbugs are tiny bugs that live in and around beds. During the day, they hide in mattresses and other places near beds. They come out at night and bite people lying in bed. They  need blood to live and grow. Bedbugs can be found in beds anywhere. Usually, they are found in places where many people come and go (hotels, shelters, hospitals). It does not matter whether the place is dirty or clean. Getting bitten by bedbugs rarely causes a medical problem. The biggest problem can be getting rid of them. This often takes the work of a Oncologist. CAUSES  Less use of pesticides. Bedbugs were common before  the 1950s. Then, strong pesticides such as DDT nearly wiped them out. Today, these pesticides are not used because they harm the environment and can cause health problems.  More travel. Besides mattresses, bedbugs can also live in clothing and luggage. They can come along as people travel from place to place. Bedbugs are more common in certain parts of the world. When people travel to those areas, the bugs can come home with them.  Presence of birds and bats. Bedbugs often infest birds and bats. If you have these animals in or near your home, bedbugs may infest your house, too. SYMPTOMS It does not hurt to be bitten by a bedbug. You will probably not wake up when you are bitten. Bedbugs usually bite areas of the skin that are not covered. Symptoms may show when you wake up, or they may take a day or more to show up. Symptoms may include:  Small red bumps on the skin. These might be lined up in a row or clustered in a group.  A darker red dot in the middle of red bumps.  Blisters on the skin. There may be swelling and very bad itching. These may be signs of an allergic reaction. This does not happen often. DIAGNOSIS Bedbug bites might look and feel like other types of insect bites. The bugs do not stay on the body like ticks or lice. They bite, drop off, and crawl away to hide. Your caregiver will probably:  Ask about your symptoms.  Ask about your recent activities and travel.  Check your skin for bedbug bites.  Ask you to check at home for signs of bedbugs. You should look for:  Spots or stains on the bed or nearby. This could be from bedbugs that were crushed or from their eggs or waste.  Bedbugs themselves. They are reddish-brown, oval, and flat. They do not fly. They are about the size of an apple seed.  Places to look for bedbugs include:  Beds. Check mattresses, headboards, box springs, and bed frames.  On drapes and curtains near the bed.  Under carpeting in the  bedroom.  Behind electrical outlets.  Behind any wallpaper that is peeling.  Inside luggage. TREATMENT Most bedbug bites do not need treatment. They usually go away on their own in a few days. The bites are not dangerous. However, treatment may be needed if you have scratched so much that your skin has become infected. You may also need treatment if you are allergic to bedbug bites. Treatment options include:  A drug that stops swelling and itching (corticosteroid). Usually, a cream is rubbed on the skin. If you have a bad rash, you may be given a corticosteroid pill.  Oral antihistamines. These are pills to help control itching.  Antibiotic medicines. An antibiotic may be prescribed for infected skin. HOME CARE INSTRUCTIONS   Take any medicine prescribed by your caregiver for your bites. Follow the directions carefully.  Consider wearing pajamas with long sleeves and pant legs.  Your bedroom may need to be treated. A  pest control expert should make sure the bedbugs are gone. You may need to throw away mattresses or luggage. Ask the pest control expert what you can do to keep the bedbugs from coming back. Common suggestions include:  Putting a plastic cover over your mattress.  Washing and drying your clothes and bedding in hot water and a hot dryer. The temperature should be hotter than 120 F (48.9 C). Bedbugs are killed by high temperatures.  Vacuuming carefully all around your bed. Vacuum in all cracks and crevices where the bugs might hide. Do this often.  Carefully checking all used furniture, bedding, or clothes that you bring into your house.  Eliminating bird nests and bat roosts.  If you get bedbug bites when traveling, check all your possessions carefully before bringing them into your house. If you find any bugs on clothes or in your luggage, consider throwing those items away. SEEK MEDICAL CARE IF:  You have red bug bites that keep coming back.  You have red bug  bites that itch badly.  You have bug bites that cause a skin rash.  You have scratch marks that are red and sore. SEEK IMMEDIATE MEDICAL CARE IF: You have a fever. Document Released: 04/28/2010 Document Revised: 06/18/2011 Document Reviewed: 04/28/2010 [facility] Patient Information 2015 [place], [place]. This information is not intended to replace advice given to you by your health care provider. Make sure you discuss any questions you have with your health care provider.

## 2014-04-19 NOTE — Assessment & Plan Note (Signed)
Pap smear collected today 

## 2014-04-20 ENCOUNTER — Telehealth: Payer: Self-pay | Admitting: Family Medicine

## 2014-04-20 LAB — HIV ANTIBODY (ROUTINE TESTING W REFLEX): HIV: NONREACTIVE

## 2014-04-20 LAB — RPR

## 2014-04-20 LAB — CYTOLOGY - PAP

## 2014-04-20 LAB — CERVICOVAGINAL ANCILLARY ONLY
Chlamydia: NEGATIVE
NEISSERIA GONORRHEA: NEGATIVE

## 2014-04-20 MED ORDER — METRONIDAZOLE 500 MG PO TABS: 500.0000 mg | ORAL_TABLET | Freq: Three times a day (TID) | ORAL | Status: DC

## 2014-04-20 NOTE — Telephone Encounter (Signed)
Please call [name], her wet prep yesterday showed a bacteria infection (BV). This is not a sexually transmitted disease, and can be treated with flagyl. I have called this in for her. Her HIV and syphilis test are negative. We will call her back when I receive the PAP and G/C culture results. Thanks.

## 2014-04-21 ENCOUNTER — Encounter: Payer: Self-pay | Admitting: Family Medicine

## 2014-04-21 ENCOUNTER — Ambulatory Visit (INDEPENDENT_AMBULATORY_CARE_PROVIDER_SITE_OTHER): Payer: BLUE CROSS/BLUE SHIELD | Admitting: *Deleted

## 2014-04-21 ENCOUNTER — Telehealth: Payer: Self-pay | Admitting: Family Medicine

## 2014-04-21 ENCOUNTER — Encounter: Payer: Self-pay | Admitting: *Deleted

## 2014-04-21 DIAGNOSIS — R87810 Cervical high risk human papillomavirus (HPV) DNA test positive: Secondary | ICD-10-CM | POA: Insufficient documentation

## 2014-04-21 DIAGNOSIS — Z111 Encounter for screening for respiratory tuberculosis: Secondary | ICD-10-CM

## 2014-04-21 DIAGNOSIS — Z7689 Persons encountering health services in other specified circumstances: Secondary | ICD-10-CM

## 2014-04-21 LAB — TB SKIN TEST
Induration: 0 mm
TB Skin Test: NEGATIVE

## 2014-04-21 NOTE — Progress Notes (Signed)
   PPD Reading Note PPD read and results entered in EpicCare. Result: 0 mm induration. Interpretation: Negative Allergic reaction: no Martin, Tamika L, RN  

## 2014-04-21 NOTE — Telephone Encounter (Signed)
Called patient today to explain lab results to her, with normal PAP, but High Risk HPV positive. She understands she will need a repeat PAP in 12 months. Reviewed RPR, Gonorrhea, Chlamydia and wet prep results as well.

## 2014-04-21 NOTE — Telephone Encounter (Signed)
Spoke with patient and informed her of below 

## 2014-05-03 ENCOUNTER — Encounter: Payer: Self-pay | Admitting: Family Medicine

## 2014-05-10 ENCOUNTER — Encounter: Payer: Self-pay | Admitting: Family Medicine

## 2014-05-10 ENCOUNTER — Ambulatory Visit (INDEPENDENT_AMBULATORY_CARE_PROVIDER_SITE_OTHER): Payer: BLUE CROSS/BLUE SHIELD | Admitting: Family Medicine

## 2014-05-10 VITALS — BP 126/81 | HR 88 | Temp 98.0°F | Ht 66.0 in | Wt 126.5 lb

## 2014-05-10 DIAGNOSIS — Z0000 Encounter for general adult medical examination without abnormal findings: Secondary | ICD-10-CM

## 2014-05-10 DIAGNOSIS — Z23 Encounter for immunization: Secondary | ICD-10-CM | POA: Diagnosis not present

## 2014-05-10 DIAGNOSIS — F329 Major depressive disorder, single episode, unspecified: Secondary | ICD-10-CM | POA: Diagnosis not present

## 2014-05-10 DIAGNOSIS — F39 Unspecified mood [affective] disorder: Secondary | ICD-10-CM | POA: Insufficient documentation

## 2014-05-10 DIAGNOSIS — F32A Depression, unspecified: Secondary | ICD-10-CM

## 2014-05-10 MED ORDER — SERTRALINE HCL 50 MG PO TABS: 50.0000 mg | ORAL_TABLET | Freq: Every day | ORAL | Status: DC

## 2014-05-10 NOTE — Assessment & Plan Note (Signed)
24 y/o female presents for preventative exam -due for Tdap which was provided today -up to date on Pap smear -discussed diet/exercise/stop smoking marijuana

## 2014-05-10 NOTE — Patient Instructions (Signed)
Depressed mood - start Zoloft today, return in 4 weeks for follow up, please call Dr. Pascal LuxKane to schedule an appointment  Tetanus shot given today

## 2014-05-10 NOTE — Progress Notes (Signed)
24 y.o. year old female presents for well woman/preventative visit. 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 Marital Status: Single    Spouse Name: N/A    Number of Children: N/A  . Years of Education: N/A   Occupational History  . student    Social History Main Topics  . Smoking status: Never Smoker   . Smokeless tobacco: Never Used  . Alcohol Use: No     Comment: not normally  . Drug Use: Yes    Special: Marijuana  . Sexual Activity: Yes    Birth Control/ Protection: IUD   Other Topics Concern  . None   Social History Narrative   05/10/13 - currently [facility] [employer], completing prerequisites for RN    Immunization:  Tdap/TD: due  Influenza: has not had    Cancer Screening:  Pap Smear:January 2016, positive for HPV   Physical Exam: VITALS: reviewed GEN: pleasant female, NAD HEENT: normocephalic, PERRL, EOMI, no scleral icterus, MMM, neck supple, no thyromegaly CARDIAC: RRR, S1 and S2 present, no murmur, no heaves/thrills RESP:CTAB, normal effort BREAST: Deferred ABD: soft, no tenderness, normal bowel sounds GU/GYN: completed at recent  exam EXT: no edema, no cuts on arms Psych: dressed appropriate, affect is appropriate, mood is depressed, no SI, no HI  ASSESSMENT & PLAN: 24 y.o. female presents for annual well woman/preventative exam. Please see problem specific assessment and plan.

## 2014-05-10 NOTE — Assessment & Plan Note (Addendum)
24 y/o female with symptoms consistent with depression. PHQ-9 score of 24, marked answer to last question as very difficult.  -initiate Zoloft 50 mg daily -discussed return precautions -provided phone number for Dr. Pascal LuxKane

## 2014-07-05 ENCOUNTER — Telehealth: Payer: Self-pay | Admitting: Family Medicine

## 2014-07-05 NOTE — Telephone Encounter (Signed)
Would like a letter orginally given to pt that her TB test was negative Please fax to; [phone]

## 2014-07-06 NOTE — Telephone Encounter (Signed)
Letter faxed to number provided.

## 2014-08-30 ENCOUNTER — Ambulatory Visit: Payer: BLUE CROSS/BLUE SHIELD | Admitting: Family Medicine

## 2015-01-14 ENCOUNTER — Ambulatory Visit (INDEPENDENT_AMBULATORY_CARE_PROVIDER_SITE_OTHER): Payer: BLUE CROSS/BLUE SHIELD | Admitting: Family Medicine

## 2015-01-14 ENCOUNTER — Encounter: Payer: Self-pay | Admitting: Family Medicine

## 2015-01-14 VITALS — BP 142/89 | HR 69 | Temp 99.0°F | Ht 66.0 in | Wt 131.6 lb

## 2015-01-14 DIAGNOSIS — F329 Major depressive disorder, single episode, unspecified: Secondary | ICD-10-CM

## 2015-01-14 DIAGNOSIS — Z23 Encounter for immunization: Secondary | ICD-10-CM

## 2015-01-14 DIAGNOSIS — F419 Anxiety disorder, unspecified: Secondary | ICD-10-CM

## 2015-01-14 DIAGNOSIS — F32A Depression, unspecified: Secondary | ICD-10-CM

## 2015-01-14 DIAGNOSIS — F418 Other specified anxiety disorders: Secondary | ICD-10-CM | POA: Diagnosis not present

## 2015-01-14 MED ORDER — CITALOPRAM HYDROBROMIDE 10 MG PO TABS: 10.0000 mg | ORAL_TABLET | Freq: Every day | ORAL | Status: DC

## 2015-01-14 NOTE — Progress Notes (Signed)
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   Presenting Issue: Ms. Randol reports feeling depressed and overwhelmed by multiple stressors. She is seeking an emotional support letter so that she might have her dog registered as an emotional support animal.   Report of symptoms:  Patient reports depressed mood with rapid fluctuations (i.e., she stated she "can be happy one minute and then crying the next"), hypersomnia (sometimes sleeps "24 hours straight"), and severe anxiety/worry accompanied by physical tension. She also endorsed irritability and being easily angered, but did not report a pattern of presentation consistent with bipolar disorder or hypomania. Specifically, she denied that her irritability ever lasts for more than a few hours at a time.She denied SI or urges to inflict non-suicidal self-injury, although she engaged in "cutting" as a teenager.  Duration of CURRENT symptoms: Patient stated that she has experienced depression and anxiety since she was 24 years old, but only recently sought treatment for depression earlier this year when she lost her job and had to move in with her parents.   Age of onset of first mood disturbance: 16 (per patient report)  Impact on function:  Patient reports that her anxiety is overwhelming, and that it impairs her ability to perform will during anxiety-inducing situations (e.g., during tests in college classes), resulting in decreased grades.  Psychiatric History - Diagnoses: Unspecified depression and anxiety (per chart and interview with patient) - Pharmacotherapy: Prescribed sertraline at her last appointment - took it regularly for two months and experienced improved mood, but discontinued due to associated nausea. - Outpatient therapy: None - patient stated that she did not have time for psychotherapy, but that she was open to following up with [facility] during 30 minute appointments.  Current and history of substance use:  Patient reports daily  marijuana use. She reported that she smokes "one blunt per day", but was unable to more specifically quantify her daily use.   PHQ-9:  22 GAD-7:  19  Assessment / Plan / Recommendations:Patient presents with symptoms of depression and anxiety. She reports difficulty coping with difficult life circumstances - she is currently working two jobs while taking college classes, and is also a single mother. She states that she is easily overwhelmed and that she frequently worries about the life she is providing for herself and her child. Currently, she uses marijuana to cope with symptoms of anxiety, and states that she would like to quit but does not have alternative coping strategies that work as well. She is open to cognitive-behavioral therapy, but states she does not have time to attend traditional CBT appointments. [facility] introduced diaphragmatic breathing as a strategy to reduce feelings of tension and anxiety. Patient will follow-up with [facility] in one week.

## 2015-01-14 NOTE — Patient Instructions (Signed)
It was nice to see you today.  Please start Celexa 10 mg daily.  Follow up with Brad in one week.  Follow up with Dr. Randolm Idol in 2-3 weeks to discuss your mood/anxiety/Celexa.

## 2015-01-14 NOTE — Progress Notes (Signed)
   Subjective:    Patient ID: Natasha Garcia, female    DOB: 1991/01/30, 24 y.o.   MRN: [id number]  HPI 24 year old female seen for follow-up of anxiety and depression. Patient was seen in conjunction with Shann Medal [facility] Psychology student).   Patient is a history of anxiety and depression, patient was last evaluated in February 2016, at that time she was started on Zoloft 50 mg daily, reports improvement of her symptoms while taking the medication however had associated nausea and "knot in her stomach ". She has since discontinued the medication.  She reports continued daily anxiety and panic attacks, she is quick to anger, tearful throughout the day, she reports that even simple things create anxiety. No homicidal or suicidal thoughts, no hallucinations, no delusions, she does report anhedonia, feeling down and depressed, she is very tired, poor appetite, trouble concentrating, previous thoughts of suicide however no current thoughts  PHQ -9 and GAD 7 completed. See scores below and scanned documents.   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), she has 1 dog and is interested in having this dog registered as a emotional support animal  Review of Systems Please see history of present illness    Objective:   Physical Exam Vitals: Reviewed Gen.: Pleasant female, anxious Psych: Appropriate dressed, anxious throughout the exam, slightly pressured speech, no tangential thoughts, does not appear to be responding to internal stimuli, no delusions, denies auditory and visual hallucinations, no homicidal or suicidal thoughts  PHQ score of 22 GAD 7 score of 19 (very difficult)     Assessment & Plan:  Anxiety and depression Patient presents for follow-up of anxiety and depression. PHQ-9 score of 22, GAD score of 19. -initiated Celexa 10 mg daily -return in one week for CBT with Shann Medal [facility] psychology student) -return in 2-3 weeks  for follow up with Dr. Randolm Idol -check TSH at next visit

## 2015-01-14 NOTE — Assessment & Plan Note (Signed)
Patient presents for follow-up of anxiety and depression. PHQ-9 score of 22, GAD score of 19. -initiated Celexa 10 mg daily -return in one week for CBT with Shann [facility] psychology student) -return in 2-3 weeks for follow up with Dr. Randolm Idol -check TSH at next visit

## 2015-01-18 ENCOUNTER — Telehealth: Payer: Self-pay | Admitting: Family Medicine

## 2015-01-18 NOTE — Telephone Encounter (Signed)
Patient says she is supposed to be giving Dr. Randolm Idol information for her Physicist, medical. Pt says the dog goes to [facility], Dr. Shelbie Hutching. [phone]

## 2015-01-20 NOTE — Telephone Encounter (Signed)
Called Dr. Sandria SenterUpshaws office. He was not in the office. Left message for him to return my call.

## 2015-01-21 ENCOUNTER — Ambulatory Visit (INDEPENDENT_AMBULATORY_CARE_PROVIDER_SITE_OTHER): Payer: BLUE CROSS/BLUE SHIELD | Admitting: Psychology

## 2015-01-21 DIAGNOSIS — F329 Major depressive disorder, single episode, unspecified: Secondary | ICD-10-CM

## 2015-01-21 DIAGNOSIS — F419 Anxiety disorder, unspecified: Principal | ICD-10-CM

## 2015-01-21 DIAGNOSIS — F418 Other specified anxiety disorders: Secondary | ICD-10-CM

## 2015-01-21 NOTE — Progress Notes (Signed)
Reason for follow-up: Ms. Alvina [facility]Coates [facility] symptoms of anxiety and depression.  Issues discussed: [facility] discussed the CBT model of therapy. Consistent with this model, [facility] and patient discussed thoughts that contribute to patient's symptoms of distress  Identified goals: Patient's primary treatment goals are to reduce symptoms of depression and anxiety. As more long-term goals, patient would like to move out of her mother's house, obtain a job as a LawyerCNA, and provide a good life for her daughter.  Medication Adherence: Patient reports that she is taking Celexa as prescribed. However, she reports that she is experiencing increased appetite, low blood pressure (she monitors her blood pressure at work daily), and difficulty falling asleep since starting the medication.  Assessment / Plan / Recommendations:  Patient reported that she frequently experiences distress when thinking about her 22-year-old daughter growing up without an involved father and thinking about the unfairness of the way her daughter's father has treated her. Specifically, she is worried that her daughter "will grow up to have attachment issues with men" or will be angry and depressed when she is older as a result of not having an adult female figure in her life. She also feels distress when she thinks about how her friends have graduated college, while she is still at least several years from graduating. Patient is also overwhelmed by the difficulty of taking part-time classes while working to support herself and her daughter. These thoughts and feelings of distress are more frequent and pronounced when patient is bored or has nothing to do. [facility] reviewed diaphragmatic breathing with patient as a method of coping with stress and anxiety. [facility] also introduced the CBT model, and discussed the nature of automatic thoughts. Last, [facility] introduced a Thought Record, and discussed objectively weighing evidence  that supports and does not support automatic thoughts as a way to cope with such thoughts. This week, patient will record her automatic thoughts when feeling distress, will continue practicing diaphragmatic breathing for 5 minutes/day, and will run for 15 minutes/day.

## 2015-01-21 NOTE — Patient Instructions (Signed)
Thank you for coming in today! This week, please try to practice these coping strategies to manage anxiety:  1. Practice diaphragmatic breathing 5 minutes/day.  2. Run/exercise 15 minutes/day.  3. Record automatic thoughts in your Thought Log.

## 2015-01-25 NOTE — Telephone Encounter (Signed)
I have not heard from Dr. Shelbie HutchingUpshaw. Left another message with his office staff for him to call me.

## 2015-01-28 ENCOUNTER — Ambulatory Visit: Payer: BLUE CROSS/BLUE SHIELD

## 2015-01-28 ENCOUNTER — Telehealth: Payer: Self-pay | Admitting: Psychology

## 2015-01-28 NOTE — Telephone Encounter (Signed)
Called patient after missed appointment and left message with instructions to call the front desk if patient is interested in rescheduling.

## 2015-02-09 NOTE — Telephone Encounter (Signed)
Called patient once more to attempt to reschedule this appointment. No answer, LVM to return call to reschedule. Natasha Garcia, ASA

## 2015-02-22 NOTE — Telephone Encounter (Signed)
RN staff - please call patient and schedule follow up appointment to discuss anxiety and emotional support animal.

## 2015-02-23 NOTE — Telephone Encounter (Signed)
LMOVM for pt to call us back to schedule an appt. Read message below. Taniah Reinecke Bruna PotterBlount, CMA

## 2015-08-03 ENCOUNTER — Ambulatory Visit (INDEPENDENT_AMBULATORY_CARE_PROVIDER_SITE_OTHER): Payer: BLUE CROSS/BLUE SHIELD | Admitting: Family Medicine

## 2015-08-03 ENCOUNTER — Encounter: Payer: Self-pay | Admitting: Family Medicine

## 2015-08-03 VITALS — BP 134/87 | HR 102 | Temp 97.8°F | Wt 125.7 lb

## 2015-08-03 DIAGNOSIS — R21 Rash and other nonspecific skin eruption: Secondary | ICD-10-CM

## 2015-08-03 NOTE — Progress Notes (Signed)
    Subjective   Natasha Garcia is a 25 y.o. female that presents for a same day visit  1. Buttock rash: Symptoms noticed about one week ago. She states the lesion started off as vesicular. The lesion was non-painful. She reports being sexually active with both men and women and has had two partners in the last year. She reports having unprotected sex, including coitus and oral sex. No fevers, nausea or vomiting. No abnormal vaginal discharge.   ROS Per HPI  Social History  Substance Use Topics  . Smoking status: Never Smoker   . Smokeless tobacco: Never Used  . Alcohol Use: No     Comment: not normally    No Known Allergies  Objective   BP 134/87 mmHg  Pulse 102  Temp(Src) 97.8 F (36.6 C) (Oral)  Wt 125 lb 11.2 oz (57.017 kg)  General: Well appearing, no distress Genitourinary: Left buttock with an approximately 1cm lesion that appears excoriated without erythema or drainage.  Assessment and Plan   1. Rash Unsure of what this rash is since it is scratched and starting to heal. Possible HSV. Will swab. Discussed with patient that HSV, although possible, is not definite. Advised to inform partner, which she has already done. - Herpes simplex virus culture

## 2015-08-03 NOTE — Patient Instructions (Signed)
Thank you for coming to see me today. It was a pleasure. Today we talked about:   Rash: It is hard to tell what this could be. I will get a test to see if it is herpes, however, since the lesion has already opened up, this test may not be accurate. I will include some information on herpes since you are very concerned about this diagnosis.  If you have any questions or concerns, please do not hesitate to call the office at [phone].  Sincerely,  Jacquelin Hawkingalph Meliya Mcconahy, MD  Genital Herpes Genital herpes is a common sexually transmitted infection (STI) that is caused by a virus. The virus is spread from person to person through sexual contact. Infection can cause itching, blisters, and sores in the genital area or rectal area. This is called an outbreak. It affects both men and women. Genital herpes is particularly concerning for pregnant women because the virus can be passed to the baby during delivery and cause serious problems. Genital herpes is also a concern for people with a weakened defense (immune) system. Symptoms of genital herpes may last several days and then go away. However, the virus remains in your body, so you may have more outbreaks of symptoms in the future. The time between outbreaks varies and can be months or years. CAUSES Genital herpes is caused by a virus called herpes simplex virus (HSV) type 2 or HSV type 1. These viruses are contagious and are most often spread through sexual contact with an infected person. Sexual contact includes vaginal, anal, and oral sex. RISK FACTORS Risk factors for genital herpes include:  Being sexually active with multiple partners.  Having unprotected sex. SIGNS AND SYMPTOMS Symptoms may include:  Pain and itching in the genital area or rectal area.  Small red bumps that turn into blisters and then turn into sores.  Flu-like symptoms, including:  Fever.  Body aches.  Painful urination.  Vaginal discharge. DIAGNOSIS Genital  herpes may be diagnosed by:  Physical exam.  Blood test.  Fluid culture test from an open sore. TREATMENT There is no cure for genital herpes. Oral antiviral medicines may be used to speed up healing and to help prevent the return of symptoms. These medicines can also help to reduce the spread of the virus to sexual partners. HOME CARE INSTRUCTIONS  Keep the affected areas dry and clean.  Take medicines only as directed by your health care provider.  Do not have sexual contact during active infections. Genital herpes is contagious.  Practice safe sex. Latex condoms and female condoms may help to prevent the spread of the herpes virus.  Avoid rubbing or touching the blisters and sores. If you do touch the blister or sores:  Wash your hands thoroughly.  Do not touch your eyes afterward.  If you become pregnant, tell your health care provider if you have had genital herpes.  Keep all follow-up visits as directed by your health care provider. This is important. PREVENTION  Use condoms. Although anyone can contract genital herpes during sexual contact even with the use of a condom, a condom can provide some protection.  Avoid having multiple sexual partners.  Talk to your sexual partner about any symptoms and past history that either of you may have.  Get tested before you have sex. Ask your partner to do the same.  Recognize the symptoms of genital herpes. Do not have sexual contact if you notice these symptoms. SEEK MEDICAL CARE IF:  Your symptoms are not improving  with medicine.  Your symptoms return.  You have new symptoms.  You have a fever.  You have abdominal pain.  You have redness, swelling, or pain in your eye. MAKE SURE YOU:  Understand these instructions.  Will watch your condition.  Will get help right away if you are not doing well or get worse.   This information is not intended to replace advice given to you by your health care provider. Make  sure you discuss any questions you have with your health care provider.   Document Released: 03/23/2000 Document Revised: 04/16/2014 Document Reviewed: 08/11/2013 Elsevier Interactive Patient Education [employer]

## 2015-08-05 LAB — HERPES SIMPLEX VIRUS CULTURE: Organism ID, Bacteria: DETECTED

## 2015-08-09 ENCOUNTER — Telehealth: Payer: Self-pay | Admitting: *Deleted

## 2015-08-09 NOTE — Telephone Encounter (Signed)
Patient called to check on status of her labs from last week. Ariely Riddell,CMA

## 2015-08-10 NOTE — Telephone Encounter (Signed)
Physician Telephone Note  Reason for call: Results  Discussion with patient: Patient positive for HSV-2. Discussed this diagnosis with her during visit so patient already well informed. Answered remaining questions.   Jacquelin Hawkingalph Zakery Normington, MD PGY-3, [facility] 08/10/2015, 8:48 AM

## 2015-08-15 ENCOUNTER — Telehealth: Payer: Self-pay | Admitting: Family Medicine

## 2015-08-15 NOTE — Telephone Encounter (Signed)
Pt called because she was seen by Dr. Caleb PoppNettey for STD, and would like to speak to her PCP to see if she is suppose to be on any medication. jw

## 2015-08-17 NOTE — Telephone Encounter (Signed)
Dr. Caleb PoppNettey - please contact the patient to discuss her results and treatment, thanks.

## 2015-08-18 NOTE — Telephone Encounter (Signed)
I had a lengthy discussion with this patient regarding her diagnosis and already informed her that she does not need treatment except for bothersome outbreaks and chronic treatment if she is having frequent outbreaks. Please inform her of this. Thanks

## 2015-08-24 ENCOUNTER — Telehealth: Payer: Self-pay | Admitting: Family Medicine

## 2015-08-24 NOTE — Telephone Encounter (Signed)
Returned patient call. Discussed recent diagnosis of HSV Type 2. No active symptoms/lesions. Discussed prn vs prophylactic treatment. Discussed safe sex practices including condoms. Will continue prn treatment.  Patient recently stopped Zoloft (takes for anxiety/panic) - counseled her to make follow up appointment so that we can discuss in more detail.

## 2015-08-24 NOTE — Telephone Encounter (Signed)
Pt called because she wants to ONLY speak to Dr. Randolm IdolFletke. She does not want to her from Dr. Caleb PoppNettey about this, since she thinks maybe something else should have been done or called in. jw

## 2015-08-26 ENCOUNTER — Encounter: Payer: Self-pay | Admitting: Family Medicine

## 2015-08-26 ENCOUNTER — Ambulatory Visit (INDEPENDENT_AMBULATORY_CARE_PROVIDER_SITE_OTHER): Payer: BLUE CROSS/BLUE SHIELD | Admitting: Family Medicine

## 2015-08-26 VITALS — BP 115/81 | HR 82 | Temp 97.8°F | Ht 66.0 in | Wt 126.0 lb

## 2015-08-26 DIAGNOSIS — B009 Herpesviral infection, unspecified: Secondary | ICD-10-CM | POA: Insufficient documentation

## 2015-08-26 MED ORDER — VALACYCLOVIR HCL 1 G PO TABS: 500.0000 mg | ORAL_TABLET | Freq: Two times a day (BID) | ORAL | Status: DC

## 2015-08-26 NOTE — Patient Instructions (Addendum)
Take Valtrex 500 mg twice daily for 3 days.  - return to clinic if your symptoms have not resolved in 7 days.  - return to clinic if you develop more than 6 outbreaks in a year.  - avoid having sex while herpes sores are present. This drug does not prevent disease transmission. - maintain adequate hydration during drug therapy to prevent precipitation of acyclovir (active drug) in your kidneys   Genital Herpes Genital herpes is a common sexually transmitted infection (STI) that is caused by a virus. The virus is spread from person to person through sexual contact. Infection can cause itching, blisters, and sores in the genital area or rectal area. This is called an outbreak. It affects both men and women. Genital herpes is particularly concerning for pregnant women because the virus can be passed to the baby during delivery and cause serious problems. Genital herpes is also a concern for people with a weakened defense (immune) system. Symptoms of genital herpes may last several days and then go away. However, the virus remains in your body, so you may have more outbreaks of symptoms in the future. The time between outbreaks varies and can be months or years. CAUSES Genital herpes is caused by a virus called herpes simplex virus (HSV) type 2 or HSV type 1. These viruses are contagious and are most often spread through sexual contact with an infected person. Sexual contact includes vaginal, anal, and oral sex. RISK FACTORS Risk factors for genital herpes include:  Being sexually active with multiple partners.  Having unprotected sex. SIGNS AND SYMPTOMS Symptoms may include:  Pain and itching in the genital area or rectal area.  Small red bumps that turn into blisters and then turn into sores.  Flu-like symptoms, including:  Fever.  Body aches.  Painful urination.  Vaginal discharge. DIAGNOSIS Genital herpes may be diagnosed by:  Physical exam.  Blood test.  Fluid culture test  from an open sore. TREATMENT There is no cure for genital herpes. Oral antiviral medicines may be used to speed up healing and to help prevent the return of symptoms. These medicines can also help to reduce the spread of the virus to sexual partners. HOME CARE INSTRUCTIONS  Keep the affected areas dry and clean.  Take medicines only as directed by your health care provider.  Do not have sexual contact during active infections. Genital herpes is contagious.  Practice safe sex. Latex condoms and female condoms may help to prevent the spread of the herpes virus.  Avoid rubbing or touching the blisters and sores. If you do touch the blister or sores:  Wash your hands thoroughly.  Do not touch your eyes afterward.  If you become pregnant, tell your health care provider if you have had genital herpes.  Keep all follow-up visits as directed by your health care provider. This is important. PREVENTION  Use condoms. Although anyone can contract genital herpes during sexual contact even with the use of a condom, a condom can provide some protection.  Avoid having multiple sexual partners.  Talk to your sexual partner about any symptoms and past history that either of you may have.  Get tested before you have sex. Ask your partner to do the same.  Recognize the symptoms of genital herpes. Do not have sexual contact if you notice these symptoms. SEEK MEDICAL CARE IF:  Your symptoms are not improving with medicine.  Your symptoms return.  You have new symptoms.  You have a fever.  You have abdominal  pain.  You have redness, swelling, or pain in your eye. MAKE SURE YOU:  Understand these instructions.  Will watch your condition.  Will get help right away if you are not doing well or get worse.   This information is not intended to replace advice given to you by your health care provider. Make sure you discuss any questions you have with your health care provider.   Document  Released: 03/23/2000 Document Revised: 04/16/2014 Document Reviewed: 08/11/2013 Elsevier Interactive Patient Education [employer] Elsevier Inc.

## 2015-08-26 NOTE — Progress Notes (Signed)
   Subjective:    Patient ID: Natasha Garcia, female    DOB: 07/02/1990, 25 y.o.   MRN: [id number]  Seen for Same day visit for   CC: Herpes  She reports being recently diagnosed with genital herpes.  Reports that initial outbreak resolved without treatment; she developed new genital ulcers 2 days ago, and desires treatment.  She denies any fevers, dysuria, vaginal discharge.   Review of Systems   See HPI for ROS. Objective:  BP 115/81 mmHg  Pulse 82  Temp(Src) 97.8 F (36.6 C) (Oral)  Ht 5\' 6"  (1.676 m)  Wt 126 lb (57.153 kg)  BMI 20.35 kg/m2  SpO2 100%  General: NAD Cardiac: RRR, normal heart sounds Respiratory: CTAB, normal effort    Assessment & Plan:   Herpes simplex type 2 infection She reports complete resolution of ulcers prior to second outbreak, so we'll treat as recurrent outbreak - Valtrex 500 mg twice a day 3 days.  - Advise treating recurrent outbreaks as soon as possible - Recommended returning if develops more than 6 outbreaks in one year to consider prophylactic therapy - Recommended condom use to reduce chance of transmission

## 2015-08-26 NOTE — Assessment & Plan Note (Signed)
She reports complete resolution of ulcers prior to second outbreak, so we'll treat as recurrent outbreak - Valtrex 500 mg twice a day 3 days.  - Advise treating recurrent outbreaks as soon as possible - Recommended returning if develops more than 6 outbreaks in one year to consider prophylactic therapy - Recommended condom use to reduce chance of transmission

## 2015-08-26 NOTE — Telephone Encounter (Signed)
PT has appointment today at 1:30pm to discuss this. Lamonte SakaiZimmerman Rumple, April D, [place]

## 2015-11-11 ENCOUNTER — Other Ambulatory Visit: Payer: Self-pay | Admitting: Family Medicine

## 2015-11-11 DIAGNOSIS — B009 Herpesviral infection, unspecified: Secondary | ICD-10-CM

## 2015-11-11 MED ORDER — VALACYCLOVIR HCL 1 G PO TABS: 500.0000 mg | ORAL_TABLET | Freq: Two times a day (BID) | ORAL | 0 refills | Status: DC

## 2015-11-11 NOTE — Telephone Encounter (Signed)
Pt called because she is having an outbreak and needs her Valtrex called in to the [facility] on [address]. jw

## 2016-01-05 ENCOUNTER — Ambulatory Visit (INDEPENDENT_AMBULATORY_CARE_PROVIDER_SITE_OTHER): Payer: BLUE CROSS/BLUE SHIELD | Admitting: Family Medicine

## 2016-01-05 ENCOUNTER — Encounter: Payer: Self-pay | Admitting: Family Medicine

## 2016-01-05 ENCOUNTER — Telehealth: Payer: Self-pay | Admitting: Family Medicine

## 2016-01-05 VITALS — BP 106/60 | HR 96 | Temp 98.4°F | Ht 66.0 in | Wt 130.6 lb

## 2016-01-05 DIAGNOSIS — F329 Major depressive disorder, single episode, unspecified: Secondary | ICD-10-CM

## 2016-01-05 DIAGNOSIS — L639 Alopecia areata, unspecified: Secondary | ICD-10-CM

## 2016-01-05 DIAGNOSIS — F418 Other specified anxiety disorders: Secondary | ICD-10-CM | POA: Diagnosis not present

## 2016-01-05 DIAGNOSIS — F419 Anxiety disorder, unspecified: Principal | ICD-10-CM

## 2016-01-05 MED ORDER — FLUOXETINE HCL 10 MG PO TABS: 10.0000 mg | ORAL_TABLET | Freq: Every day | ORAL | 0 refills | Status: DC

## 2016-01-05 NOTE — Telephone Encounter (Signed)
Nursing staff - please call patient and clarify which form she is referring to, also schedule appointment as I have not seen her for her anxiety in many months.

## 2016-01-05 NOTE — Progress Notes (Signed)
    Subjective:  Natasha Garcia is a 25 y.o. female who presents to the [facility] today with a chief complaint of bald spots and anxiety.  HPI:  Bald spots: Patient states that she has been itching her head for one to 2 months. There are 3 particular spots where she has been focusing on.  At times she uses pens and pencils to itch the areas.  Yesterday she was with a friend who noticed that she had missing patches of hair.  She was concerned about this and decided to come in to get evaluated.  She denies any previous episodes of losing hair, weight loss, or weight gain.  She denies picking at the areas.   Anxiety: Patient has previously documented anxiety. She has tried Zoloft and Celexa in the past.  She has been off Celexa for several months at this point stated that it was not working for her after 2 months of being on the medicine.  Previously she had met with behavioral health here in clinic and stated that it was somewhat helpful.  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  Today she was open to the idea of being with our Education officer, museum and getting reestablished with behavioral health clinic.  She denies any thoughts of hurting herself or others.    GAD7-17  PMH: anxiety and depression ROS: see HPI  Objective:  Physical Exam: BP 106/60   Pulse 96   Temp 98.4 F (36.9 C) (Oral)   Ht 5' 6" (1.676 m)   Wt 130 lb 9.6 oz (59.2 kg)   LMP 12/26/2015 (Exact Date)   BMI 21.08 kg/m   Gen: 25 year old female in NAD, resting comfortably CV: RRR with no murmurs appreciated Pulm: NWOB, CTAB with no crackles, wheezes, or rhonchi GI: Normal bowel sounds present. Soft, Nontender, Nondistended. MSK: no edema, cyanosis, or clubbing noted Skin: warm, dry Neuro: grossly normal, moves all extremities Psych: Normal affect and thought content, tearful at times.  No results found for this or any previous visit (from the past 72  hour(s)).   Assessment/Plan:  Anxiety and depression Patient presents today with anxiety and has GAD-7 of 17.  States that she wishes to establish with behavioral health clinic and was amenable to starting medication.  She met with Casimer Lanius her social worker today  - Start fluoxetine 10 mg daily for 4-5 days if no side effects start 20 milligrams daily.   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  Reassured patient that she will not be losing all of her hair. - We will watch for now - Available to discuss any concerns with patient

## 2016-01-05 NOTE — Progress Notes (Signed)
Patient ID: Natasha Garcia, female   DOB: 05/12/1990, 25 y.o.   MRN: [id number]  [facility]. Myrtie SomanWarden for anxiety.     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 Patient has experienced the following symptoms:  difficulty breathing, tearfulness, sweating etc. for several years, however noticed it has gotten worse over the past several months. States this is due to life stressors.  Symptoms currently impacting her at school. Per patient, she has tried relaxed breathing in the past, but states it does not work. [facility], patient appreciative of support offered. Patient denies SI, smokes cannabis to calm down. [facility].  GAD-7 not completed during this assessment. Depression screen PHQ 2/9 01/05/2016  Decreased Interest 0  Down, Depressed, Hopeless 0  PHQ - 2 Score 0   Assessment/Recommendation  Patient has history of anxiety which is exacerbated by life stressors. Patient would benefit from and is in agreement to receive further assessment and brief therapeutic interventions to assist with managing stressors, anxiety and emotions.  Interventions utilized: established a therapeutic relationship, psycho-education, Reflective listening, Visualization, Relaxation and Meditation.  Patient was provide educational information on anxiety, as well as relaxation techniques demonstrated during session.  Shared information on the "calm app".  Patient was excited to try this as it provided a model for her to follow with relations techniques.  Patient's Goals: Is to be able to manage her emotions and anxiety Plan: [facility]. She will try the relaxed breathing using the calm app.  LCSW will follow up with patient via phone [phone] in one week.    Sammuel Hineseborah Chimene Salo, LCSW Licensed  Clinical Social Worker [facility]   [phone] 12:00 PM

## 2016-01-05 NOTE — Patient Instructions (Signed)
Natasha Garcia today you were seen for some bald spots on your scalp.  Due to the appearance, I believe this is alopecia acreta.  We will just watch this for now.   We also discussed discussed your anxiety and you agreed to start on prozac.  I would like you to take one pill daily (10mg  total) for 5 days and if you have no side effects, I would start taking 2 pills daily.  It may take up to 4 weeks to notice changes.    Please follow up in one month.   Nice meeting you  Reuel BoomDaniel L. Myrtie SomanWarden, MD [facility] Resident PGY-1 01/05/2016 11:10 AM

## 2016-01-05 NOTE — Telephone Encounter (Signed)
Pt called because she is in college and would like Dr. Randolm IdolFletke to fill out so that she can have better accomodations at the dorm because of her anxiety, Please call to discuss ASAP since the time frame is small. jw

## 2016-01-06 ENCOUNTER — Telehealth: Payer: Self-pay | Admitting: Family Medicine

## 2016-01-06 ENCOUNTER — Encounter: Payer: Self-pay | Admitting: Family Medicine

## 2016-01-06 DIAGNOSIS — L639 Alopecia areata, unspecified: Secondary | ICD-10-CM | POA: Insufficient documentation

## 2016-01-06 NOTE — Assessment & Plan Note (Signed)
Patient's clinical picture and appearance of hair loss seems to be consistent with alopecia acreata.  Reassured patient that she will not be losing all of her hair. - We will watch for now - Available to discuss any concerns with patient

## 2016-01-06 NOTE — Telephone Encounter (Signed)
Spoke with patient, she states she has been having a lot of anxiety around test taking and even going to class. Patient states she has dropped of information regarding what PCP would need to do for patient to receive accommodations. Paperwork in PCP box (see phone note from today). Also patient has scheduled an appointment with PCP to discuss anxiety issues on 10/27

## 2016-01-06 NOTE — Telephone Encounter (Signed)
Called and discussed with patient. She is having extreme test anxiety. Requests more time to take tests and to have quiet testing environment alone. Completed letter and placed in fax box.

## 2016-01-06 NOTE — Telephone Encounter (Signed)
Information placed in PCP box.

## 2016-01-06 NOTE — Telephone Encounter (Signed)
Patient came to office requesting letter from pcp for school showing her anxiety issues. Patient has brought form from disability services at school showing what is needed in this letter in order to best accommodate her with her disability. Patient has requested Dr Randolm IdolFletke give her a call at [phone] to discuss. Form placed in Red team folder at front

## 2016-01-06 NOTE — Assessment & Plan Note (Signed)
Patient presents today with anxiety and has GAD-7 of 17.  States that she wishes to establish with behavioral health clinic and was amenable to starting medication.  She met with Casimer Lanius her social worker today  - Start fluoxetine 10 mg daily for 4-5 days if no side effects start 20 milligrams daily.   - LCSW will follow-up with patient via phone in one week - Patient to return to [facility] in 2 weeks

## 2016-01-09 ENCOUNTER — Telehealth: Payer: Self-pay | Admitting: Psychology

## 2016-01-09 NOTE — Telephone Encounter (Signed)
[facility] Loletta Specter(Vaibhav) called patient to discuss setting up a behavioral health appointment. Phone kept ringing, no voicemail option.

## 2016-01-13 ENCOUNTER — Telehealth: Payer: Self-pay | Admitting: Psychology

## 2016-01-13 NOTE — Telephone Encounter (Signed)
[facility] message introducing self and encouraging patient to schedule with behavioral health.

## 2016-01-27 ENCOUNTER — Ambulatory Visit (INDEPENDENT_AMBULATORY_CARE_PROVIDER_SITE_OTHER): Payer: BLUE CROSS/BLUE SHIELD | Admitting: Psychology

## 2016-01-27 DIAGNOSIS — F1220 Cannabis dependence, uncomplicated: Secondary | ICD-10-CM | POA: Insufficient documentation

## 2016-01-27 DIAGNOSIS — F39 Unspecified mood [affective] disorder: Secondary | ICD-10-CM

## 2016-01-27 NOTE — Assessment & Plan Note (Signed)
Smoking since 16.  Reports smoking between 3-5 blunts daily.  She recognizes (at least she says) that her anxiety might be in part because of her marijuana use.  It is the only reported thing that keeps her calm at the end of the work day when she returns home to live with several family members and her mom (mom and step-dad recently separated).  It would be very difficult to stop on her own so I recommended [facility] and formal substance abuse treatment.  I asked her to consider this while also acknowledging the benefits she perceives from [facility].  Ultimately, we can not treat her mood issues well in the context of her current substance use.  Of note, given her substances use, unclear diagnosis, and history of buying benzodiazepines from friends, she is not a good candidate for benzodiazepine treatment.    See patient instructions for further plan.

## 2016-01-27 NOTE — Assessment & Plan Note (Signed)
Patient is neatly groomed and appropriately dressed.  She maintains good eye contact and is cooperative and attentive.  Speech is mildly accelerated in rate.  Normal in tone and rhythm.  Mood is irritable with an affect within normal limits.  Thought process is logical and goal directed.  No evidence of suicidal or homicidal ideation.  Does not appear to be responding to any internal stimuli.  Able to maintain train of thought and concentrate on the questions.  Judgment and insight are average to above.  Prior trials of SSRIs were stopped reportedly due to stomach upset.  Current trial is well tolerated but not perceived as helpful.  With substance abuse history, possible family history, and current report of symptoms, I wonder if a different class of medicine would be helpful.  Apart from this and more importantly, is the possible effect the marijuana is having on her mood and anxiety.  See substance dependence problem.  Will consider a referral to [facility] for medication assistance however, I know substance abuse treatment will be at the top of Dr. Broadus JohnManning's list.

## 2016-01-27 NOTE — Patient Instructions (Signed)
I am sorry you were left in the waiting room so long.  I am glad you waited.  So the bottom line is that we can't tell what kind of mental health issues you might be having when you are using like you are.  Precise diagnosis is necessary for accurate treatment.    Please consider what you want to do about this.  I suspect you would need treatment given the length of time and amount you are using.  The [facility] might be a good place.    I will follow up with you by phone in a few days to check in and see what you are thinking.  I will also touch base with Dr. Randolm IdolFletke for next steps.
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
## 2016-02-01 ENCOUNTER — Other Ambulatory Visit: Payer: Self-pay | Admitting: *Deleted

## 2016-02-01 ENCOUNTER — Telehealth: Payer: Self-pay | Admitting: Psychology

## 2016-02-01 DIAGNOSIS — F329 Major depressive disorder, single episode, unspecified: Secondary | ICD-10-CM

## 2016-02-01 DIAGNOSIS — F419 Anxiety disorder, unspecified: Principal | ICD-10-CM

## 2016-02-01 MED ORDER — FLUOXETINE HCL 10 MG PO TABS: 10.0000 mg | ORAL_TABLET | Freq: Every day | ORAL | 2 refills | Status: DC

## 2016-02-01 NOTE — Telephone Encounter (Signed)
Called patient as discussed last appointment.  She reported she knows she can't stop the Marijuana right now.  She is afraid what might happen if she did (mainly with her temper and acting out).  She does believe it makes her anxiety worse.  Discussed options including coming in to [facility].  I reiterated that we do not like to treat until we have a reasonable chance at an accurate diagnosis.  With her current level of use, that is very challenging.  However, it is likely worth a discussion.  She believes that she has a Bipolar Illness based on her own experiences.  Since our appointment, she spoke to her father who stated that his father was diagnosed with Bipolar Disorder.  We scheduled for 12/6 at 9:00.  She was unable to attend our first available.  She stated she may have an exam this day and therefore may not to reschedule.    I told her the following: -  If she is unable to make the appointment she needs to call me. -  If she misses the appointment without a phone call, I won't be able to schedule her back in my clinic. She voiced an understanding and was able to repeat the appointment date and time back to me.

## 2016-02-01 NOTE — Progress Notes (Signed)
   Subjective:    Patient ID: Natasha Garcia, female    DOB: 05/16/1990, 25 y.o.   MRN: [id number]  HPI 25 y/o female presents for follow up of anxiety/depression.   Anxiety/Depression Seen by Dr. Pascal LuxKane on 01/27/16. Currently taking Prozac 20 mg daily. Uses Marijuana and purchases Xanax from friends to cope with anxiety. Did not notice much improvement with medication. Using marijuana daily ~6-7 blunts/day. Has "tantrums" and "panic attacks". Quickly aggravated and lashes out at others. Has upcoming appointment in mood clinic on December 6. Denies staying up for days at a time.   Vaginal irritation no discharge, no vesicular lesions, one sex partner (does not use condoms), would like STD check, history of HSV type 2, no dysuria.    Review of Systems  Constitutional: Negative for chills and fever.  Gastrointestinal: Negative for nausea and vomiting.       Objective:   Physical Exam BP 114/79   Pulse 74   Temp 98.4 F (36.9 C) (Oral)   Wt 128 lb (58.1 kg)   LMP 01/19/2016   BMI 20.66 kg/m   Gen: pleasant female, NAD GU: chaperone present, normal external female genitalia, copious vaginal discharge, wet prep and cultures obtained, unable to visualize cervix due to posterior position and copious discharge, no cervical or uterine tenderness on bimanual exam Psych: well groomed, affect is outgoing, mood is anxious, some tangential thoughts, mild flight of ideas, some pressured speech, no HI, no SI  PHQ 9 score of 16 (very difficult) GAD7 score of 19 (extremely difficult) MDQ score of 8 (moderate problem) - this is a positive screen     Assessment & Plan:  Vaginal irritation Patient has vaginal irritation and possible STD exposure. -wet prep, cultures, HIV, RPR, Hepatitis panel obtained and pending at time of discharge  Episodic mood disorder (HCC) 25 y/o female presents for follow up of mood. PHQ 9 score of 16 (very difficult) GAD7 score of 19 (extremely difficult) MDQ score  of 8 (moderate problem) - this is a positive screen Clinically suspect Bipolar Disorder. Has upcoming appointment in mood clinic. Will not change any medications at this visit however will discuss with Dr. Pascal LuxKane to see if we could consider mood stabilizer prior to this visit.

## 2016-02-03 ENCOUNTER — Ambulatory Visit (INDEPENDENT_AMBULATORY_CARE_PROVIDER_SITE_OTHER): Payer: BLUE CROSS/BLUE SHIELD | Admitting: Family Medicine

## 2016-02-03 ENCOUNTER — Other Ambulatory Visit (HOSPITAL_COMMUNITY)
Admission: RE | Admit: 2016-02-03 | Discharge: 2016-02-03 | Disposition: A | Discharge status: Home / Self Care | Payer: BLUE CROSS/BLUE SHIELD | Source: Ambulatory Visit | Attending: Family Medicine | Admitting: Family Medicine

## 2016-02-03 ENCOUNTER — Telehealth: Payer: Self-pay | Admitting: Family Medicine

## 2016-02-03 ENCOUNTER — Encounter: Payer: Self-pay | Admitting: Family Medicine

## 2016-02-03 DIAGNOSIS — N898 Other specified noninflammatory disorders of vagina: Secondary | ICD-10-CM | POA: Diagnosis not present

## 2016-02-03 DIAGNOSIS — Z113 Encounter for screening for infections with a predominantly sexual mode of transmission: Secondary | ICD-10-CM | POA: Diagnosis present

## 2016-02-03 DIAGNOSIS — A5901 Trichomonal vulvovaginitis: Secondary | ICD-10-CM | POA: Insufficient documentation

## 2016-02-03 DIAGNOSIS — F39 Unspecified mood [affective] disorder: Secondary | ICD-10-CM | POA: Diagnosis not present

## 2016-02-03 LAB — POCT WET PREP (WET MOUNT): Clue Cells Wet Prep Whiff POC: POSITIVE

## 2016-02-03 MED ORDER — METRONIDAZOLE 500 MG PO TABS: 500.0000 mg | ORAL_TABLET | Freq: Two times a day (BID) | ORAL | 0 refills | Status: AC

## 2016-02-03 NOTE — Telephone Encounter (Signed)
Notified patient of positive wet prep (Trich + BV). Sent in Flagyl 500 mg BID.

## 2016-02-03 NOTE — Assessment & Plan Note (Signed)
Patient has vaginal irritation and possible STD exposure. -wet prep, cultures, HIV, RPR, Hepatitis panel obtained and pending at time of discharge

## 2016-02-03 NOTE — Assessment & Plan Note (Signed)
25 y/o female presents for follow up of mood. PHQ 9 score of 16 (very difficult) GAD7 score of 19 (extremely difficult) MDQ score of 8 (moderate problem) - this is a positive screen Clinically suspect Bipolar Disorder. Has upcoming appointment in mood clinic. Will not change any medications at this visit however will discuss with Dr. Pascal LuxKane to see if we could consider mood stabilizer prior to this visit.

## 2016-02-03 NOTE — Patient Instructions (Signed)
It was nice to see you today.   Dr. Randolm IdolFletke will call you with your lab results.  Dr. Randolm IdolFletke will call you after speaking with Dr. Pascal LuxKane.

## 2016-02-04 LAB — HEPATITIS PANEL, ACUTE
HCV AB: NEGATIVE
HEP B S AG: NEGATIVE
Hep A IgM: NONREACTIVE
Hep B C IgM: NONREACTIVE

## 2016-02-04 LAB — HIV ANTIBODY (ROUTINE TESTING W REFLEX): HIV: NONREACTIVE

## 2016-02-04 LAB — RPR

## 2016-02-06 ENCOUNTER — Telehealth: Payer: Self-pay | Admitting: Family Medicine

## 2016-02-06 LAB — CERVICOVAGINAL ANCILLARY ONLY
CHLAMYDIA, DNA PROBE: NEGATIVE
Neisseria Gonorrhea: NEGATIVE

## 2016-02-06 NOTE — Telephone Encounter (Signed)
Called and discussed STD results with patient.

## 2016-02-09 ENCOUNTER — Telehealth: Payer: Self-pay | Admitting: Family Medicine

## 2016-02-09 NOTE — Telephone Encounter (Signed)
Pt is calling and needs to speak to the doctor about her anxiety issues and her job. Please call her as soon as you can. jw

## 2016-02-10 NOTE — Telephone Encounter (Signed)
Returned patient call.   Patient reports a recent anxiety attack. Feels like she can't work/go to school until she is started on therapy. Sitting in group in her class. Became very irritated with Professor. She felt like he wasn't understanding what she was saying. Went to bathroom and felt like "blood is boiling", saw black spots in her eyes. Reports images of wanting to hurt others (for example wanting to strangle her professor). States that she would never act on these feelings/visions.   Met with advisor yesterday. Did not discuss the above thoughts.   Counseled patient that I would be happy to fill out FMLA paperwork for her to get time off work.

## 2016-02-14 NOTE — Telephone Encounter (Signed)
RN staff - It is my understanding that all employers must allow their employees to apply for FMLA. Inform the patient of this and ask for employer information. I will be happy to reach out to her employer if needed.

## 2016-02-14 NOTE — Telephone Encounter (Signed)
Pt called back stating her job does not have FMLA and she just needs a note stating why she is unable to work. Please advise. Thanks! ep

## 2016-02-20 NOTE — Telephone Encounter (Signed)
Pt is calling back to speak to one of the nurse for Dr. Randolm IdolFletke about the letter to be out of work. She said that her employer told her that they do not do FMLA. Please call patient to discuss. If possible fax a letter to her employer [phone] attention Inetta Fermoina. Natasha Jacobsonjw

## 2016-02-20 NOTE — Telephone Encounter (Signed)
Spoke to patient over the telephone. She have permission for me to contact employer about FMLA/letter.  Attempted to contact Inetta Fermoina at [facility] [employer] however line was busy.

## 2016-02-20 NOTE — Telephone Encounter (Signed)
Left voice message for patient to give one of the nurses a call back regarding calling her employer about FMLA information per Dr. Randolm IdolFletke.  [name], Tamika L, RN

## 2016-02-20 NOTE — Telephone Encounter (Signed)
Tamika please call the patient and address the issue stated below. Thanks.

## 2016-02-21 ENCOUNTER — Encounter: Payer: Self-pay | Admitting: Family Medicine

## 2016-02-21 NOTE — Telephone Encounter (Signed)
Spoke with [employer] Berendes from [employer]. He stated that the company only needs a letter from the patient's doctor stating that she needs leave. No FMLA paperwork in required. I will create and fax letter to [phone].  RN staff - please call patient and inform her that I will be sending a letter to her employer.

## 2016-02-21 NOTE — Telephone Encounter (Signed)
Patient informed that Dr. Randolm IdolFletke has spoken with her employer and a letter will be created and faxed to number provided.  Advised patient to call with any other questions/concerns.  [name], Tamika L, RN

## 2016-03-09 ENCOUNTER — Other Ambulatory Visit: Payer: Self-pay | Admitting: Family Medicine

## 2016-03-09 NOTE — Telephone Encounter (Signed)
Pt believes she has trich again, partner was tested and it was negative, but believes it could be laying dormant in partner and that's why she keeps getting it. [facility] [address]Pt states she has previously discussed this issue with Dr. Randolm IdolFletke. Please advise. Thanks! ep

## 2016-03-11 NOTE — Telephone Encounter (Signed)
RN staff - please call patient and inform her that she needs to be re-tested, please make same day appointment.

## 2016-03-12 NOTE — Telephone Encounter (Signed)
Pt informed and scheduled for an appt. Deseree Blount, CMA  

## 2016-03-14 ENCOUNTER — Ambulatory Visit: Payer: BLUE CROSS/BLUE SHIELD | Admitting: Family Medicine

## 2016-03-14 ENCOUNTER — Ambulatory Visit (INDEPENDENT_AMBULATORY_CARE_PROVIDER_SITE_OTHER): Payer: BLUE CROSS/BLUE SHIELD | Admitting: Psychology

## 2016-03-14 ENCOUNTER — Encounter: Payer: Self-pay | Admitting: Family Medicine

## 2016-03-14 ENCOUNTER — Ambulatory Visit (INDEPENDENT_AMBULATORY_CARE_PROVIDER_SITE_OTHER): Payer: BLUE CROSS/BLUE SHIELD | Admitting: Family Medicine

## 2016-03-14 VITALS — BP 100/76 | HR 67 | Temp 98.6°F | Ht 66.0 in | Wt 129.2 lb

## 2016-03-14 DIAGNOSIS — B3731 Acute candidiasis of vulva and vagina: Secondary | ICD-10-CM | POA: Insufficient documentation

## 2016-03-14 DIAGNOSIS — F39 Unspecified mood [affective] disorder: Secondary | ICD-10-CM

## 2016-03-14 DIAGNOSIS — F1220 Cannabis dependence, uncomplicated: Secondary | ICD-10-CM

## 2016-03-14 DIAGNOSIS — B373 Candidiasis of vulva and vagina: Secondary | ICD-10-CM | POA: Insufficient documentation

## 2016-03-14 DIAGNOSIS — N898 Other specified noninflammatory disorders of vagina: Secondary | ICD-10-CM

## 2016-03-14 LAB — POCT WET PREP (WET MOUNT)
CLUE CELLS WET PREP WHIFF POC: NEGATIVE
TRICHOMONAS WET PREP HPF POC: ABSENT

## 2016-03-14 MED ORDER — METRONIDAZOLE 500 MG PO TABS: 500.0000 mg | ORAL_TABLET | Freq: Two times a day (BID) | ORAL | 0 refills | Status: DC

## 2016-03-14 NOTE — Progress Notes (Signed)
   Subjective:   Natasha Garcia is a 25 y.o. female with a history of Trichomonas, HSV-2, marijuana dependence, episodic mood disorder here for concern for trichomonas  Vaginal discharge - Described as white malodorous - Present for 3-4 days - Sexually active with one female partner - Does not use condoms - Denies dysuria, fevers, chills, nausea, vomiting - Previously diagnosed with bacterial vaginosis and trichomonas on 02/03/16 and feels that this is similar - She took her full course of Flagyl to treat this and had her boyfriend tested - He tested negative for Trichomonas that he did not get treated, but she feels like it has come back  Review of Systems:  Per HPI.   Social History: Never smoker  Objective:  BP 100/76   Pulse 67   Temp 98.6 F (37 C) (Oral)   Ht 5\' 6"  (1.676 m)   Wt 129 lb 3.2 oz (58.6 kg)   BMI 20.85 kg/m   Gen:  25 y.o. female in NAD  HEENT: NCAT, MMM, anicteric sclerae CV: RRR, no MRG Resp: Non-labored, CTAB, no wheezes noted Abd: Soft, NTND, BS present, no guarding or organomegaly GYN:  External genitalia within normal limits.  Vaginal mucosa pink, moist, normal rugae.  Nonfriable cervix without lesions, Thin white discharge, no bleeding noted on speculum exam.  Bimanual exam revealed normal, nongravid uterus.  No cervical motion tenderness. No adnexal masses bilaterally. Ext: WWP, no edema Neuro: Alert and oriented, speech normal     Assessment & Plan:     Natasha Garcia is a 25 y.o. female here for vaginal discharge  Vaginal discharge Concern for possible repeat infection with Trichomonas as patient's partner was not treated Advised patient that it is difficult to accurately test for trichomonas in men and he should be treated Wet prep collected today - no trich seen, but will still treat with flagyl as suspicion is high F/u prn   Erasmo DownerAngela M Taygen Newsome, MD MPH PGY-3,  [facility] 03/14/2016  10:40 AM

## 2016-03-14 NOTE — Patient Instructions (Signed)
It was so good to see you today.  Thanks for making your health a priority.    You were able to recall the treatment plan: 1.  Stop smoking marijuana.  You said you know you can do this.  You have the ability to do what you put your mind to. 2.  Keep taking the Fluoxetine.  It will be interesting to see what this does for you when you are no longer smoking. 3.  Start therapy.  Please schedule an appointment up front to see Dr. Pascal LuxKane on 12/13 at 11:00.  Call me at [phone] if you can't make this appointment.   4.  Consider an exercise program.  It has good evidence for helping with your mood.

## 2016-03-14 NOTE — Assessment & Plan Note (Addendum)
Concern for possible repeat infection with Trichomonas as patient's partner was not treated Advised patient that it is difficult to accurately test for trichomonas in men and he should be treated Wet prep collected today - no trich seen, but will still treat with flagyl as suspicion is high F/u prn

## 2016-03-14 NOTE — Patient Instructions (Signed)
Trichomoniasis °Trichomoniasis is an infection caused by an organism called Trichomonas. The infection can affect both women and men. In women, the outer female genitalia and the vagina are affected. In men, the penis is mainly affected, but the prostate and other reproductive organs can also be involved. Trichomoniasis is a sexually transmitted infection (STI) and is most often passed to another person through sexual contact.  °RISK FACTORS °Having unprotected sexual intercourse. °Having sexual intercourse with an infected partner. °SIGNS AND SYMPTOMS  °Symptoms of trichomoniasis in women include: °Abnormal gray-green frothy vaginal discharge. °Itching and irritation of the vagina. °Itching and irritation of the area outside the vagina. °Symptoms of trichomoniasis in men include:  °Penile discharge with or without pain. °Pain during urination. This results from inflammation of the urethra. °DIAGNOSIS  °Trichomoniasis may be found during a Pap test or physical exam. Your health care provider may use one of the following methods to help diagnose this infection: °Testing the pH of the vagina with a test tape. °Using a vaginal swab test that checks for the Trichomonas organism. A test is available that provides results within a few minutes. °Examining a urine sample. °Testing vaginal secretions. °Your health care provider may test you for other STIs, including HIV. °TREATMENT  °You may be given medicine to fight the infection. Women should inform their health care provider if they could be or are pregnant. Some medicines used to treat the infection should not be taken during pregnancy. °Your health care provider may recommend over-the-counter medicines or creams to decrease itching or irritation. °Your sexual partner will need to be treated if infected. °Your health care provider may test you for infection again 3 months after treatment. °HOME CARE INSTRUCTIONS  °Take medicines only as directed by your health care  provider. °Take over-the-counter medicine for itching or irritation as directed by your health care provider. °Do not have sexual intercourse while you have the infection. °Women should not douche or wear tampons while they have the infection. °Discuss your infection with your partner. Your partner may have gotten the infection from you, or you may have gotten it from your partner. °Have your sex partner get examined and treated if necessary. °Practice safe, informed, and protected sex. °See your health care provider for other STI testing. °SEEK MEDICAL CARE IF:  °You still have symptoms after you finish your medicine. °You develop abdominal pain. °You have pain when you urinate. °You have bleeding after sexual intercourse. °You develop a rash. °Your medicine makes you sick or makes you throw up (vomit). °MAKE SURE YOU: °Understand these instructions. °Will watch your condition. °Will get help right away if you are not doing well or get worse. °This information is not intended to replace advice given to you by your health care provider. Make sure you discuss any questions you have with your health care provider. °Document Released: 09/19/2000 Document Revised: 04/16/2014 Document Reviewed: 01/05/2013 °Elsevier Interactive Patient Education © 2017 Elsevier Inc. °  °

## 2016-03-15 ENCOUNTER — Telehealth: Payer: Self-pay | Admitting: Psychology

## 2016-03-15 NOTE — Progress Notes (Signed)
Reason for follow-up:  Patient was initially seen in Integrated Care.  Referred to Mood Clinic to see if medications could be altered to be better manage mood in the context of heavy, daily marijuana use.  Goal of the team was to use motivational interviewing techniques to address marijuana use.    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  Contemplated quitting school too but decided to continue at [employer] majoring in Health Care Management.  Thinks she will finish the semester and pass.  Plans to get a job and sit out the next semester.  Home:  Now living with a female friend.  Has a 2 month romantic relationship with a man that lives in [place].  Reports this to be a safe relationship.    Stress:  Major stress currently is being a single mom.  Reports she is "never going to be okay with being a single mom."  Views it as unfair.    Marijuana use:  "I smoke like someone smokes cigarettes."  She reported smoking while driving and prior to her appointment today.     Identified goals:  1.  Feel better in general 2.  Less lashing out when stressed / improved emotional control

## 2016-03-15 NOTE — Telephone Encounter (Signed)
Called patient to wish her good luck and to provide additional support if needed on her goal to stop marijuana today.  Left a generic VM.

## 2016-03-15 NOTE — Assessment & Plan Note (Signed)
Used Motivational Interviewing to address.  She understands there is a downside to her use - she feels increased anxiety / irritability when she isn't using that she can attribute to the effects of marijuana.  She quit once when she was pregnant and through breast feeding so has been successful.  She appeared very ambivalent at the outset but quickly engaged in change talk including, "My aunt always tells me I do anything I put my mind to and she is right.  I've always been like that."  At meeting end she said she wanted to quit marijuana - specifically (perhaps) to see if the Prozac might actually be helpful if she weren't also self-medicating.  She identified the 7th as her quit date

## 2016-03-15 NOTE — Assessment & Plan Note (Signed)
Diagnosis remains unclear.  Family history appears strong for Bipolar Disorder based on patient report.  Patient has never had a period of three days or more (or any sustained period of time) of elevated, expansive, or irritable mood per her report today.  She has periods of increased irritability that seem mostly stress related.    Sleep is disturbed - to bed around 2:00 or 3:00 am unless she takes "sleep-aid" or melatonin.  Up at 7:00 am.  She seems smart, insightful, and engaged.  Explained in broad brush strokes the biopsychosocial model and put into those categories the things she told us that are likely affecting her mood (including the fact she is at an age where some mental health issues declare themselves).    Good first step was to see what her brain might look like off of marijuana coupled with psychotherapy.  She seemed onboard with this plan.    See patient instructions for further plan.

## 2016-03-21 ENCOUNTER — Telehealth: Payer: Self-pay | Admitting: Psychology

## 2016-03-21 ENCOUNTER — Ambulatory Visit: Payer: BLUE CROSS/BLUE SHIELD | Admitting: Psychology

## 2016-03-21 NOTE — Telephone Encounter (Signed)
Patient missed therapy appointment scheduled for today.  Called and left a VM.

## 2016-04-17 ENCOUNTER — Telehealth: Payer: Self-pay | Admitting: Family Medicine

## 2016-04-17 NOTE — Telephone Encounter (Signed)
Spoke with patient and informed her of message from MD, first available appointment isnt until 04/27/2015, patient states this is too long and she needs letter before then. Patient would like to know if MD could work her in sooner (states PCP needs to write letter because he was the one who wrote her acommodations letter in the past)
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
## 2016-04-17 NOTE — Telephone Encounter (Signed)
RN staff - please call patient and schedule an appointment, I am not able to write the letter without her coming in to see me and discuss this issue in more detail.
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
## 2016-04-18 ENCOUNTER — Encounter: Payer: Self-pay | Admitting: Family Medicine

## 2016-04-18 NOTE — Telephone Encounter (Signed)
Letter completed and placed in front office for pickup.

## 2016-05-02 ENCOUNTER — Ambulatory Visit: Payer: BLUE CROSS/BLUE SHIELD | Admitting: Psychology

## 2016-06-06 ENCOUNTER — Other Ambulatory Visit: Payer: Self-pay | Admitting: Family Medicine

## 2016-06-06 DIAGNOSIS — B009 Herpesviral infection, unspecified: Secondary | ICD-10-CM

## 2016-06-06 MED ORDER — VALACYCLOVIR HCL 1 G PO TABS: 500.0000 mg | ORAL_TABLET | Freq: Two times a day (BID) | ORAL | 0 refills | Status: DC

## 2016-06-06 NOTE — Telephone Encounter (Signed)
Needs refill on valtrex.  She is having an outbreak right nog.  [facility] [address]

## 2016-07-13 ENCOUNTER — Other Ambulatory Visit: Payer: Self-pay | Admitting: Family Medicine

## 2016-07-13 ENCOUNTER — Telehealth: Payer: Self-pay | Admitting: Internal Medicine

## 2016-07-13 DIAGNOSIS — B009 Herpesviral infection, unspecified: Secondary | ICD-10-CM

## 2016-07-13 MED ORDER — VALACYCLOVIR HCL 500 MG PO TABS: 500.0000 mg | ORAL_TABLET | Freq: Two times a day (BID) | ORAL | 0 refills | Status: DC

## 2016-07-13 NOTE — Telephone Encounter (Signed)
**  After Hours/ Emergency Line Call*  Received a call to report that Natasha Garcia. Reports that she has a genital herpes outbreak and would like a refill of her Valtrex. She asked if she could have multiple refills for this or if she has to call each time she has an outbreak. I told her that she would need to discuss this with her PCP, but I will send in an Rx for treatment. Sent in Valtrex  BID x 3 days without refill.  Will forward to PCP.  Palma Holter, MD PGY-2, [facility]

## 2016-07-15 NOTE — Telephone Encounter (Signed)
RN staff - please inform the patient that she will need to contact the office for a refill of her valtrex with each outbreak, if she has multiple outbreaks in one year we can consider a daily medication to help prevent outbreaks.

## 2016-07-17 ENCOUNTER — Other Ambulatory Visit: Payer: Self-pay | Admitting: Family Medicine

## 2016-07-17 DIAGNOSIS — B009 Herpesviral infection, unspecified: Secondary | ICD-10-CM

## 2016-07-20 ENCOUNTER — Ambulatory Visit (INDEPENDENT_AMBULATORY_CARE_PROVIDER_SITE_OTHER): Payer: BLUE CROSS/BLUE SHIELD | Admitting: Internal Medicine

## 2016-07-20 ENCOUNTER — Encounter: Payer: Self-pay | Admitting: Internal Medicine

## 2016-07-20 VITALS — BP 104/82 | HR 83 | Temp 98.4°F | Wt 129.0 lb

## 2016-07-20 DIAGNOSIS — B009 Herpesviral infection, unspecified: Secondary | ICD-10-CM

## 2016-07-20 DIAGNOSIS — Z111 Encounter for screening for respiratory tuberculosis: Secondary | ICD-10-CM | POA: Diagnosis not present

## 2016-07-20 MED ORDER — VALACYCLOVIR HCL 500 MG PO TABS: 500.0000 mg | ORAL_TABLET | Freq: Two times a day (BID) | ORAL | 11 refills | Status: DC

## 2016-07-20 NOTE — Assessment & Plan Note (Signed)
Currently asymptomatic, however wishing to obtain prophylaxis for future possible outbreaks. Discussed daily prophylaxis vs prophylaxis at first sign of outbreak. Patient wishing to begin as needed prophylaxis. Discussed importance of beginning treatment at first sign of outbreak rather than waiting until lesions appear. Patient voiced understanding. Also discussed safe sex practices and condom use at length, as well as increased risk of acquiring STDs when lesions are present. Patient voiced understanding.  - Valtrex  BID x3d for outbreak with 11 refills

## 2016-07-20 NOTE — Patient Instructions (Addendum)
It was nice meeting you today Natasha Garcia!  Please be sure to begin taking Valtrex 200 mg (one tablet) twice a day as soon as you feel the first sign of an outbreak. It is also important to make sure you are using condoms with every sexual encounter.   If you have any questions or concerns, please feel free to call the clinic.   Be well,  Dr. Natale Milch

## 2016-07-20 NOTE — Progress Notes (Signed)
   Subjective:   Patient: Natasha Garcia       Birthdate: February 03, 1991       MRN: [id number]      HPI  [name] is a 26 y.o. female presenting for herpes prophylaxis.   HSV-2 Patient has had multiple outbreaks in the past year, and is hoping for HSV prophylaxis. She has had three outbreaks for which she has been seen since initial diagnosis in 2017, but says she has had multiple other outbreaks as well. She has responded well to 3d treatment with Valtrex in the past. She says she is currently asymptomatic. She does not want to take daily prophylaxis if possible, and would prefer as needed prophylaxis. At onset of outbreak, patient experiences vaginal tingling and pain prior to appearance of lesions. She does not always use condoms, but does abstain from intercourse during outbreaks. She has Mirena for birth control that is scheduled to be taken out in April 2019.   Smoking status reviewed. Patient is never smoker.   Review of Systems See HPI.     Objective:  Physical Exam  Constitutional: She is oriented to person, place, and time and well-developed, well-nourished, and in no distress.  HENT:  Head: Normocephalic and atraumatic.  Eyes: Conjunctivae and EOM are normal. Right eye exhibits no discharge. Left eye exhibits no discharge.  Pulmonary/Chest: Effort normal. No respiratory distress.  Neurological: She is alert and oriented to person, place, and time.  Skin: Skin is warm and dry.  Psychiatric: Affect and judgment normal.      Assessment & Plan:  Herpes simplex type 2 infection Currently asymptomatic, however wishing to obtain prophylaxis for future possible outbreaks. Discussed daily prophylaxis vs prophylaxis at first sign of outbreak. Patient wishing to begin as needed prophylaxis. Discussed importance of beginning treatment at first sign of outbreak rather than waiting until lesions appear. Patient voiced understanding. Also discussed safe sex practices and condom use at  length, as well as increased risk of acquiring STDs when lesions are present. Patient voiced understanding.  - Valtrex  BID x3d for outbreak with 11 refills - Precepted with Dr. Leveda Anna.   Tarri Abernethy, MD, MPH PGY-2 [facility] Pager [phone]

## 2016-07-23 ENCOUNTER — Ambulatory Visit (INDEPENDENT_AMBULATORY_CARE_PROVIDER_SITE_OTHER): Payer: BLUE CROSS/BLUE SHIELD | Admitting: *Deleted

## 2016-07-23 DIAGNOSIS — Z111 Encounter for screening for respiratory tuberculosis: Secondary | ICD-10-CM

## 2016-07-23 LAB — TB SKIN TEST
Induration: 0 mm
TB Skin Test: NEGATIVE

## 2016-07-23 NOTE — Progress Notes (Signed)
PPD Reading Note PPD read and results entered in EpicCare. Result: 0 mm induration. Interpretation: negative If test not read within 48-72 hours of initial placement, patient advised to repeat in other arm 1-3 weeks after this test. Allergic reaction: no  Letter given to patient to take to her job. Jazmin Hartsell,CMA

## 2016-08-20 ENCOUNTER — Telehealth: Payer: Self-pay | Admitting: Family Medicine

## 2016-08-20 NOTE — Telephone Encounter (Signed)
Copy of TB results left up front for pickup.  Pt would need to sign a release of information to have results faxed to employer.  [name], Nain Rudd L, RN

## 2016-08-20 NOTE — Telephone Encounter (Signed)
Pt is calling and would like a copy of her PPD results faxed to her employer. [employer] [phone]. jw

## 2016-08-26 ENCOUNTER — Ambulatory Visit (HOSPITAL_COMMUNITY)
Admission: EM | Admit: 2016-08-26 | Discharge: 2016-08-26 | Disposition: A | Discharge status: Home / Self Care | Payer: BLUE CROSS/BLUE SHIELD | Attending: Internal Medicine | Admitting: Internal Medicine

## 2016-08-26 ENCOUNTER — Encounter (HOSPITAL_COMMUNITY): Payer: Self-pay | Admitting: Emergency Medicine

## 2016-08-26 DIAGNOSIS — B029 Zoster without complications: Secondary | ICD-10-CM | POA: Diagnosis not present

## 2016-08-26 HISTORY — DX: Anxiety disorder, unspecified: F41.9

## 2016-08-26 HISTORY — DX: Personal history of other infectious and parasitic diseases: Z86.19

## 2016-08-26 MED ORDER — VALACYCLOVIR HCL 1 G PO TABS: 1000.0000 mg | ORAL_TABLET | Freq: Two times a day (BID) | ORAL | 0 refills | Status: AC

## 2016-08-26 MED ORDER — HYDROCODONE-ACETAMINOPHEN 5-325 MG PO TABS: 1.0000 | ORAL_TABLET | Freq: Four times a day (QID) | ORAL | 0 refills | Status: DC | PRN

## 2016-08-26 NOTE — ED Provider Notes (Signed)
CSN: [id number]     Arrival date & time 08/26/16  1338 History   First MD Initiated Contact with Patient 08/26/16 1440     Chief Complaint  Patient presents with  . Rash   (Consider location/radiation/quality/duration/timing/severity/associated sxs/prior Treatment) 26 year old female presents to clinic with a chief complaint of a painful, blistering, rash on her right calf. States the pain started prior to the rash being present, and states she had an identical rash in the same area approximately one year ago. States the pain is intense, and burning. She denies any itching, she has had no fever, chills, general malaise, loss of appetite, or other symptoms. Rash is been present for approximately 6 days. She denies any exposure to poison ivy, poison oak, new detergents, soaps, hygiene products, or other medications.   The history is provided by the patient.  Rash    Past Medical History:  Diagnosis Date  . Anxiety   . Hx of herpes genitalis   . No pertinent past medical history    Past Surgical History:  Procedure Laterality Date  . NO PAST SURGERIES     Family History  Problem Relation Age of Onset  . Cancer Maternal Grandmother   . Diabetes Neg Hx   . Heart disease Neg Hx   . Stroke Neg Hx   . Anesthesia problems Neg Hx    Social History  Substance Use Topics  . Smoking status: Never Smoker  . Smokeless tobacco: Never Used  . Alcohol use No     Comment: not normally   OB History    Gravida Para Term Preterm AB Living   1             SAB TAB Ectopic Multiple Live Births                 Review of Systems  Constitutional: Negative.   HENT: Negative.   Respiratory: Negative.   Cardiovascular: Negative.   Gastrointestinal: Negative.   Musculoskeletal: Negative.   Skin: Positive for color change and rash.  Neurological: Negative.     Allergies  Patient has no known allergies.  Home Medications   Prior to Admission medications   Medication Sig Start Date End  Date Taking? Authorizing Provider  levonorgestrel (MIRENA) 20 MCG/24HR IUD 1 each by Intrauterine route once. Inserted 07/15/12   Yes [provider]  valACYclovir (VALTREX) 500 MG tablet Take 1 tablet (500 mg total) by mouth 2 (two) times daily. 07/20/16  Yes Marquette Saa, MD  HYDROcodone-acetaminophen (NORCO/VICODIN) 5-325 MG tablet Take 1 tablet by mouth every 6 (six) hours as needed. 08/26/16   Dorena Bodo, NP  valACYclovir (VALTREX) 1000 MG tablet Take 1 tablet (1,000 mg total) by mouth 2 (two) times daily. 08/26/16 09/09/16  Dorena Bodo, NP   Meds Ordered and Administered this Visit  Medications - No data to display  BP 122/68 (BP Location: Right Arm)   Pulse 88   Temp 98.3 F (36.8 C) (Oral)   Resp 18   SpO2 98%  No data found.   Physical Exam  Constitutional: She is oriented to person, place, and time. She appears well-developed and well-nourished. No distress.  HENT:  Head: Normocephalic and atraumatic.  Right Ear: External ear normal.  Left Ear: External ear normal.  Eyes: Conjunctivae are normal.  Neck: Normal range of motion.  Neurological: She is alert and oriented to person, place, and time.  Skin: Skin is warm and dry. Capillary refill takes less than 2 seconds.  Rash noted. She is not diaphoretic.  See attached photograph  Psychiatric: She has a normal mood and affect. Her behavior is normal.  Nursing note and vitals reviewed.     Urgent Care Course     Procedures (including critical care time)  Labs Review Labs Reviewed - No data to display  Imaging Review No results found.      MDM   1. Herpes zoster without complication    Started on acyclovir, given hydrocodone for pain. Provided counseling on wound care. Return to clinic as needed     Dorena BodoKennard, Jacobs Golab, NP 08/26/16 1502

## 2016-08-26 NOTE — Discharge Instructions (Signed)
I am treating you for shingles. I have started you on Valacyclovir, take 1 tablet twice a day for 7 days. I have also prescribed a medicine for pain called hydrocodone, this medicine is a narcotic, it will cause drowsiness, and it is addictive. Do not take more than what is necessary, do not drink alcohol while taking, and do not operate any heavy machinery while taking this medicine. Keep your wound clean, dry, and covered. Return to clinic or follow up with primary care in one week as needed.

## 2016-08-26 NOTE — ED Triage Notes (Signed)
[facility] a [facility] rash on the back of her right leg x 6 days that she believed to be poison oak.

## 2016-08-28 ENCOUNTER — Ambulatory Visit (INDEPENDENT_AMBULATORY_CARE_PROVIDER_SITE_OTHER): Payer: BLUE CROSS/BLUE SHIELD | Admitting: Family Medicine

## 2016-08-28 ENCOUNTER — Encounter: Payer: Self-pay | Admitting: Family Medicine

## 2016-08-28 VITALS — BP 116/68 | HR 99 | Temp 99.3°F | Ht 66.0 in | Wt 132.0 lb

## 2016-08-28 DIAGNOSIS — R21 Rash and other nonspecific skin eruption: Secondary | ICD-10-CM | POA: Diagnosis not present

## 2016-08-28 MED ORDER — MUPIROCIN 2 % EX OINT: 1.0000 "application " | TOPICAL_OINTMENT | Freq: Three times a day (TID) | CUTANEOUS | 0 refills | Status: DC

## 2016-08-28 NOTE — Progress Notes (Signed)
   HPI  CC: RASH Patient reporting skin rash that developed approximately one week ago. Site was tender prior to any previously noted skin lesion. She was seen in the ED 2 days ago and provided Valtrex for concern for shingles.  Had rash for 7 days. Location: Right posterior calf Medications tried: Acyclovir Similar rash in past: Yes, patient reports a similar/identical rash approximately one year ago, after a fishing trip. New medications or antibiotics: no Tick, Insect or new pet exposure: no Recent travel: no New detergent or soap: no Immunocompromised: no  Symptoms Itching: yes Pain over rash: yes Feeling ill all over: no Fever: no Mouth sores: no Face or tongue swelling: no Trouble breathing: no Joint swelling or pain: no  Review of Symptoms - see HPI PMH - Smoking status noted.    CC, SH/smoking status, and VS noted  Objective: BP 116/68   Pulse 99   Temp 99.3 F (37.4 C) (Oral)   Ht 5\' 6"  (1.676 m)   Wt 132 lb (59.9 kg)   SpO2 98%   BMI 21.31 kg/m  Gen: NAD, alert, cooperative. CV: Well-perfused. Resp: Non-labored. Neuro: Sensation intact throughout. Integument: 5 x 5 cm circular lesion noted with a central ulceration. Lesion is slightly erythematous with numerous vesicles. No dermatomal bandlike presentation or distribution. No evidence of purulence.  [See photo below.]     Assessment and plan:  Rash Patient is here with a rash on the posterior aspect of her right calf. Originally seen and diagnosed in ED with herpes zoster. Unfortunately no dermatomal presentation is noted, and I'm not convinced this is the current diagnosis. Presentation today appears most likely bullous impetigo. One issue with this diagnosis is that patient reports a previous rash in this same location last year. Differential diagnosis includes possible HSV-2 >> as patient has a previous diagnosis of this and possible spread by cross-contamination during an active flare with an open  wound is not completely unreasonable to comprehend. This would also support the history of a previous "flare" last year. - Initiating Bactroban 3 times a day for treatment of bullous impetigo. Patient informed to reassess for any improvement in 4-5 days >> if improving patient is to continue for a total of 10 days. If no improvement is noted and patient is to come back for reevaluation. - Bacterial and HSV cultures obtained and sent.  - Will contact patient if cultures yield a diagnosis that is not treated with Bactroban.   Orders Placed This Encounter  Procedures  . Anaerobic and Aerobic Culture  . Herpes simplex virus culture    Order Specific Question:   Source    Answer:   back of right calf/leg    Meds ordered this encounter  Medications  . mupirocin ointment (BACTROBAN) 2 %    Sig: Apply 1 application topically 3 (three) times daily.    Dispense:  22 g    Refill:  0     Kathee DeltonIan D McKeag, MD,MS,  PGY3 08/28/2016 6:36 PM

## 2016-08-28 NOTE — Assessment & Plan Note (Addendum)
Patient is here with a rash on the posterior aspect of her right calf. Originally seen and diagnosed in ED with herpes zoster. Unfortunately no dermatomal presentation is noted, and I'm not convinced this is the current diagnosis. Presentation today appears most likely bullous impetigo. One issue with this diagnosis is that patient reports a previous rash in this same location last year. Differential diagnosis includes possible HSV-2 >> as patient has a previous diagnosis of this and possible spread by cross-contamination during an active flare with an open wound is not completely unreasonable to comprehend. This would also support the history of a previous "flare" last year. - Initiating Bactroban 3 times a day for treatment of bullous impetigo. Patient informed to reassess for any improvement in 4-5 days >> if improving patient is to continue for a total of 10 days. If no improvement is noted and patient is to come back for reevaluation. - Bacterial and HSV cultures obtained and sent.  - Will contact patient if cultures yield a diagnosis that is not treated with Bactroban.

## 2016-08-28 NOTE — Patient Instructions (Signed)
It was a pleasure seeing you today in our clinic. Today we discussed your rash. Here is the treatment plan we have discussed and agreed upon together:   There is concerned that he may have developed what is called "bullous impetigo". - I've prescribed an antibiotic cream called Bactroban. Apply this to the site 3 times a day. Continue use for a total of 10 days. - After 5 days if you have not had any improvement please schedule a follow-up appointment for reevaluation.

## 2016-08-30 ENCOUNTER — Telehealth: Payer: Self-pay | Admitting: Family Medicine

## 2016-08-30 ENCOUNTER — Other Ambulatory Visit: Payer: Self-pay | Admitting: Family Medicine

## 2016-08-30 MED ORDER — MUPIROCIN 2 % EX OINT: 1.0000 "application " | TOPICAL_OINTMENT | Freq: Three times a day (TID) | CUTANEOUS | 2 refills | Status: DC

## 2016-08-30 NOTE — Telephone Encounter (Signed)
Will forward to MD who saw patient. Natasha Garcia,CMA  

## 2016-08-30 NOTE — Telephone Encounter (Signed)
Pt states the spot on her leg is not getting better. She is wondering what else can be done.  Also she is wondering about the culture results. She needs a refill on the cream she was given.  [facility]  [address]

## 2016-08-30 NOTE — Telephone Encounter (Signed)
Culture results not yet back. Refill order placed.  It has been 2 days. I informed her to self-reassess in 5 >> so I'm not surprised.

## 2016-08-30 NOTE — Telephone Encounter (Signed)
Message given to pt

## 2016-08-30 NOTE — Telephone Encounter (Signed)
Tried calling patient back but there was no answer.  Jacqulyne Gladue,CMA

## 2016-08-31 LAB — HERPES SIMPLEX VIRUS CULTURE

## 2016-09-02 LAB — ANAEROBIC AND AEROBIC CULTURE

## 2016-09-04 ENCOUNTER — Encounter: Payer: Self-pay | Admitting: *Deleted

## 2016-09-04 ENCOUNTER — Telehealth: Payer: Self-pay | Admitting: Family Medicine

## 2016-09-04 NOTE — Telephone Encounter (Signed)
That's fine by me. Can someone get this for her?

## 2016-09-04 NOTE — Telephone Encounter (Signed)
Letter up front for pickup, patient aware.

## 2016-09-04 NOTE — Telephone Encounter (Signed)
Pt was seen on may 22 for a rash. Her rash is gone now.  She would like to have a drs note stating she is ok to go back to work. 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

## 2016-09-10 ENCOUNTER — Other Ambulatory Visit: Payer: Self-pay | Admitting: Family Medicine

## 2016-09-10 MED ORDER — MUPIROCIN 2 % EX OINT
1.0000 | TOPICAL_OINTMENT | Freq: Three times a day (TID) | CUTANEOUS | 1 refills | Status: DC
Start: 2016-09-10 — End: 2016-09-21

## 2016-09-10 NOTE — Telephone Encounter (Signed)
Spot on her leg is getting better but she has run out of her mupirocin ointment.  She would like a refill.  [facility] [address]

## 2016-09-14 ENCOUNTER — Telehealth: Payer: Self-pay | Admitting: Family Medicine

## 2016-09-14 NOTE — Telephone Encounter (Signed)
Pt needs another refill on bactriban. She has already used the tube she got on Monday. She doesn't have enough to make it thru the weekend.   She wonders if she needs another appt to come and have it checked

## 2016-09-17 NOTE — Telephone Encounter (Signed)
Patient shouldn't need any additional antibiotic ointment. If this wound is not healing or is worse then, yes, she should be seen.

## 2016-09-17 NOTE — Telephone Encounter (Signed)
Patient evaluated by Dr. Wende MottMcKeag last week. Will forward to him to address.

## 2016-09-18 NOTE — Telephone Encounter (Signed)
Then have patient schedule f/u

## 2016-09-18 NOTE — Telephone Encounter (Signed)
Pt informed. Says its scaring up and has a little opening, no draining etc. She said she would put antibiotic ointment on it.Deseree Bruna PotterBlount, CMA

## 2016-09-21 ENCOUNTER — Telehealth: Payer: Self-pay | Admitting: Family Medicine

## 2016-09-21 MED ORDER — MUPIROCIN 2 % EX OINT: 1.0000 "application " | TOPICAL_OINTMENT | Freq: Three times a day (TID) | CUTANEOUS | 1 refills | Status: DC

## 2016-09-21 NOTE — Addendum Note (Signed)
Addended by: [name], TAMIKA L on: 09/21/2016 02:33 PM   Modules accepted: Orders

## 2016-09-21 NOTE — Telephone Encounter (Signed)
Pt was diagnosed with infantigo on her leg recently and wants to talk to the nurse about her leg. ep

## 2016-09-21 NOTE — Telephone Encounter (Signed)
Return call to patient regarding rash on leg.  Pt stated it is not getting any better. Pt stated she know she suppose to follow-up if it didn't get better.  However, patient is not able to come today due to her work schedule.  Precept with Dr. Randolm IdolFletke; have pt schedule an appt for Monday 09/24/16 and refill Bactroban ointment.  Appt 09/24/16 Monday at 8:30 AM with Dr. Kennon RoundsHaney.  [name], [name], RN

## 2016-09-24 ENCOUNTER — Encounter: Payer: Self-pay | Admitting: Student

## 2016-09-24 ENCOUNTER — Ambulatory Visit (INDEPENDENT_AMBULATORY_CARE_PROVIDER_SITE_OTHER): Payer: BLUE CROSS/BLUE SHIELD | Admitting: Student

## 2016-09-24 DIAGNOSIS — R21 Rash and other nonspecific skin eruption: Secondary | ICD-10-CM | POA: Diagnosis not present

## 2016-09-24 MED ORDER — TRIAMCINOLONE ACETONIDE 0.5 % EX OINT: 1.0000 "application " | TOPICAL_OINTMENT | Freq: Two times a day (BID) | CUTANEOUS | 1 refills | Status: DC

## 2016-09-24 NOTE — Patient Instructions (Addendum)
Return in 1 week with PCP for Rash  Take Triamcinolone cream as prescribed Use Eucerin cream and vaseline 3-4 times daily for hydration Call the office with questions or concerns

## 2016-09-24 NOTE — Progress Notes (Signed)
   Subjective:    Patient ID: Natasha Garcia, female    DOB: 01/02/1991, 26 y.o.   MRN: [id number]   CC: Rash  HPI: 26 y/o F presents with rash  Rash - on left left - presviously treated for concern for shingles versus impetigo - has been taking bactroban - she feels is has just become dry and itchy -  No fevers, no pain or dranage  Smoking status reviewed  Review of Systems  Per HPI, else denies recent illness, fever, chest pain, shortness of breath,     Objective:  BP 118/74   Pulse 79   Temp 99 F (37.2 C)   Wt 129 lb (58.5 kg)   LMP 09/23/2016   SpO2 99%   BMI 20.82 kg/m  Vitals and nursing note reviewed  General: NAD Cardiac: RRR,  Respiratory: CTAB, normal effort Skin:  Well demarcated dry, scaling rash measuring about 4-5 cm in diameter in left posterior calf, no erythema else warm and dry Neuro: alert and oriented, no focal deficits   Assessment & Plan:    Rash and nonspecific skin eruption Rash over previous weeping area, now dry and scaling and very pruritic. Looks more eczematous in appearance at this time - will treat with triamcionolone - hydration with eucerin or other thick lotions - follow in one week if no improvement or earlier as needed    Alyssa A. Kennon RoundsHaney MD, MS Family Medicine Resident PGY-3 Pager [phone]

## 2016-09-24 NOTE — Assessment & Plan Note (Addendum)
Rash over previous weeping area, now dry and scaling and very pruritic. Looks more eczematous in appearance at this time - will treat with triamcionolone - hydration with eucerin or other thick lotions - follow in one week if no improvement or earlier as needed

## 2016-11-09 ENCOUNTER — Ambulatory Visit (INDEPENDENT_AMBULATORY_CARE_PROVIDER_SITE_OTHER): Payer: BLUE CROSS/BLUE SHIELD | Admitting: Family Medicine

## 2016-11-09 ENCOUNTER — Other Ambulatory Visit (HOSPITAL_COMMUNITY)
Admission: RE | Admit: 2016-11-09 | Discharge: 2016-11-09 | Disposition: A | Discharge status: Home / Self Care | Payer: BLUE CROSS/BLUE SHIELD | Source: Ambulatory Visit | Attending: Family Medicine | Admitting: Family Medicine

## 2016-11-09 VITALS — BP 112/78 | HR 63 | Temp 98.1°F | Wt 136.0 lb

## 2016-11-09 DIAGNOSIS — Z113 Encounter for screening for infections with a predominantly sexual mode of transmission: Secondary | ICD-10-CM | POA: Insufficient documentation

## 2016-11-09 DIAGNOSIS — Z30432 Encounter for removal of intrauterine contraceptive device: Secondary | ICD-10-CM | POA: Diagnosis not present

## 2016-11-09 DIAGNOSIS — Z202 Contact with and (suspected) exposure to infections with a predominantly sexual mode of transmission: Secondary | ICD-10-CM

## 2016-11-09 DIAGNOSIS — L739 Follicular disorder, unspecified: Secondary | ICD-10-CM

## 2016-11-09 MED ORDER — MUPIROCIN CALCIUM 2 % EX CREA: 1.0000 "application " | TOPICAL_CREAM | Freq: Two times a day (BID) | CUTANEOUS | 0 refills | Status: DC

## 2016-11-09 MED ORDER — DOXYCYCLINE HYCLATE 100 MG PO TABS: 100.0000 mg | ORAL_TABLET | Freq: Two times a day (BID) | ORAL | 0 refills | Status: DC

## 2016-11-09 NOTE — Patient Instructions (Addendum)
You are being sent home with doxycycline and bactroban for a skin infection called folliculitis. Please take these as prescribed for 10 days. You will be scheduled for procedure/colpo clinic for IUD removal as soon as possible You are being sent home with literature on the various options for contraception, please review these for contraception options after your IUD removal You are getting a urine gc/chlamydia test, blood rpr, and blood hiv test today We will call you with these results

## 2016-11-10 LAB — RPR: RPR: NONREACTIVE

## 2016-11-10 LAB — HIV ANTIBODY (ROUTINE TESTING W REFLEX): HIV Screen 4th Generation wRfx: NONREACTIVE

## 2016-11-12 LAB — URINE CYTOLOGY ANCILLARY ONLY
Chlamydia: NEGATIVE
Neisseria Gonorrhea: NEGATIVE

## 2016-11-14 ENCOUNTER — Telehealth: Payer: Self-pay | Admitting: Family Medicine

## 2016-11-14 NOTE — Telephone Encounter (Signed)
Family Medicine Telephone note  Called and informed patient about the results of her recent std screen. I told her that rpr, hiv, gc, chlamydia were all negative. All questions answered.  Asencion Gowda- Jake Arliss Hepburn

## 2016-11-19 ENCOUNTER — Encounter: Payer: Self-pay | Admitting: Family Medicine

## 2016-11-19 DIAGNOSIS — L739 Follicular disorder, unspecified: Secondary | ICD-10-CM | POA: Insufficient documentation

## 2016-11-19 DIAGNOSIS — Z309 Encounter for contraceptive management, unspecified: Secondary | ICD-10-CM | POA: Insufficient documentation

## 2016-11-19 NOTE — Assessment & Plan Note (Signed)
Will plan to collect HIV, RPR, GC/Chlamydia today - Patient to follow up in clinic if abnormal result

## 2016-11-19 NOTE — Progress Notes (Signed)
HPI 26 y/o female who presents due to wanting her IUD removed. She states that she has grown increasingly "freaked out" about the possible negative side effects of having an IUD and that she wants to have it removed. Patient is due to have IUD removed in April 2019. Patient has not thought about what kind of birth control she would like to replace her IUD with. She has had no problems with her current Mirena and has used no other forms to the best of her recollection.  She also states that she would like to have an std screen done. She has not had any risky sexual behavior and has always used a condom to the best of her knowledge.  Her last concern is a small area of redness and itchiness on her lateral right leg. She states that this has started around a week ago and has slowly increased in size and redness over that time period.    CC: "wants IUD removed"   ZOX:WRUEAV of Systems  Constitutional: Negative for chills, fever and weight loss.  HENT: Negative for congestion, hearing loss and nosebleeds.   Respiratory: Negative for cough and hemoptysis.   Cardiovascular: Negative for chest pain and palpitations.  Gastrointestinal: Negative for abdominal pain, constipation, diarrhea, nausea and vomiting.  Genitourinary: Negative for dysuria and urgency.  Musculoskeletal: Negative for back pain, joint pain and myalgias.  Skin: Positive for rash.  Neurological: Negative for dizziness, weakness and headaches.  Psychiatric/Behavioral: Negative for depression and suicidal ideas.    Review of Systems See HPI for ROS.   CC, SH/smoking status, and VS noted  Objective: BP 112/78   Pulse 63   Temp 98.1 F (36.7 C) (Oral)   Wt 136 lb (61.7 kg)   BMI 21.95 kg/m  Gen: NAD, alert, cooperative, and pleasant. HEENT: NCAT, EOMI, PERRL CV: RRR, no murmur Resp: CTAB, no wheezes, non-labored Abd: SNTND, BS present, no guarding or organomegaly Ext: No edema, warm Extremities: 5/5 strength BLE,  BUE. Small area of erythema and induration on lateral RLE. Neuro: Alert and oriented, Speech clear, No gross deficits   Assessment and plan:  Contraception management Patient with desire to have current IUD removed. Convinced patient to delay having IUD taken out so that she can weigh all of her birth control options. Would like to have alternative plan in place prior to removal, so that she can be transitioned into new plan smoothly. Will have her follow up in procedures clinic in 1-2 weeks for removal, and administration of alternative therapy. - F/U in mine/procedure clinic in 1-2 weeks for IUD removal - Will implement alternative birth control form at that time  Possible exposure to STD Will plan to collect HIV, RPR, GC/Chlamydia today - Patient to follow up in clinic if abnormal result  Folliculitis Given that patient has developed erythema, swelling, and tenderness in area it is likely infectious in etiology. Area has multiple foci and patient reports that this did start after she shaved her legs. Believe folliculitis with superimposed cellulitis is most likely culprit. Idelle Jo for topical application - doxycycline 100mg  bid for 10 days for underlying induration - Patient to follow up in 1-2 weeks if does not resolve   Orders Placed This Encounter  Procedures  . HIV antibody (with reflex)  . RPR    Meds ordered this encounter  Medications  . mupirocin cream (BACTROBAN) 2 %    Sig: Apply 1 application topically 2 (two) times daily.    Dispense:  30  g    Refill:  0  . doxycycline (VIBRA-TABS) 100 MG tablet    Sig: Take 1 tablet (100 mg total) by mouth 2 (two) times daily.    Dispense:  20 tablet    Refill:  0     Myrene BuddyJacob Devorah Givhan MD PGY-1 Family Medicine Residency 11/19/2016 10:17 AM

## 2016-11-19 NOTE — Assessment & Plan Note (Signed)
Given that patient has developed erythema, swelling, and tenderness in area it is likely infectious in etiology. Area has multiple foci and patient reports that this did start after she shaved her legs. Believe folliculitis with superimposed cellulitis is most likely culprit. Natasha Garcia- bactroban for topical application - doxycycline 100mg  bid for 10 days for underlying induration - Patient to follow up in 1-2 weeks if does not resolve

## 2016-11-19 NOTE — Assessment & Plan Note (Signed)
Patient with desire to have current IUD removed. Convinced patient to delay having IUD taken out so that she can weigh all of her birth control options. Would like to have alternative plan in place prior to removal, so that she can be transitioned into new plan smoothly. Will have her follow up in procedures clinic in 1-2 weeks for removal, and administration of alternative therapy. - F/U in mine/procedure clinic in 1-2 weeks for IUD removal - Will implement alternative birth control form at that time

## 2016-12-14 ENCOUNTER — Telehealth: Payer: Self-pay | Admitting: Family Medicine

## 2016-12-14 ENCOUNTER — Telehealth: Payer: Self-pay | Admitting: Internal Medicine

## 2016-12-14 MED ORDER — VALACYCLOVIR HCL 500 MG PO TABS: 500.0000 mg | ORAL_TABLET | Freq: Two times a day (BID) | ORAL | 11 refills | Status: DC

## 2016-12-14 NOTE — Telephone Encounter (Signed)
It appears Dr. Ottie GlazierGunadasa sent in a new rx already today Natasha Garcia Say, MD

## 2016-12-14 NOTE — Telephone Encounter (Signed)
Pharmacy unable to provide refills due to [employer] [facility] NPI not working.

## 2016-12-14 NOTE — Telephone Encounter (Signed)
**  After Hours/ Emergency Line Call*  Received a call to report that Natasha Garcia . 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 Her doctor told her that she had sent refills the last time she was seen in clinic, but when she called the pharmacy they do not have any refills for her.  She called our clinic for this and per chart review, "pharmacy unable to provide  refills due to [employer] NPI not working".   I will resend rx to see if patient will be able to get this filled.   Palma HolterKanishka G Gunadasa, MD PGY-3, [facility]

## 2016-12-14 NOTE — Telephone Encounter (Signed)
Pt needs refill on valacyclovoir. Rx shows it has 11 refills but the pharmacy says she has none.  Pt states she hasnt had an outbreak since April.  She would like this refilled ASAP

## 2017-01-09 ENCOUNTER — Encounter: Payer: Self-pay | Admitting: Internal Medicine

## 2017-01-09 ENCOUNTER — Ambulatory Visit (INDEPENDENT_AMBULATORY_CARE_PROVIDER_SITE_OTHER): Payer: BLUE CROSS/BLUE SHIELD | Admitting: Internal Medicine

## 2017-01-09 VITALS — BP 102/78 | HR 83 | Temp 98.3°F | Wt 131.0 lb

## 2017-01-09 DIAGNOSIS — Z23 Encounter for immunization: Secondary | ICD-10-CM

## 2017-01-09 DIAGNOSIS — F39 Unspecified mood [affective] disorder: Secondary | ICD-10-CM

## 2017-01-09 MED ORDER — ESCITALOPRAM OXALATE 10 MG PO TABS: ORAL_TABLET | ORAL | 0 refills | Status: DC

## 2017-01-09 NOTE — Patient Instructions (Signed)
Ms. Fuchs,  Thank you for coming in today.  I sent a prescription for lexapro to your pharmacy. Please start with 10 mg. You can increase to 20 mg after 1 week if you tolerate the lower dose. I think you would have best results if you also worked with a Veterinary surgeon at your school. Regular exercise can also improve mood.  Please follow-up in about 3 weeks. Stop the medication if you have worsening mood or thoughts of harming yourself.  Best, Dr. Sampson Goon

## 2017-01-11 DIAGNOSIS — F39 Unspecified mood [affective] disorder: Secondary | ICD-10-CM | POA: Insufficient documentation

## 2017-01-11 NOTE — Progress Notes (Signed)
[facility] Progress Note  Subjective:  Natasha Garcia is a 26 y.o. female with history of anxiety who presents to discuss mood.   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 Does not think she is depressed because she "normally sleeps all the time" when that is the case and she is "too busy for that." - Has noticed increased irritability especially - Also has been picking skin on her hands more - Interested in taking medication at least for the last 8 months she will be in school to help with stress - Tried zoloft and prozac, each for about 2 months and did not notice any improvement. - Continues to use marijuana most days but denies feeling different on days she smokes vs on those that she doesn't - She can get so overwhelmed during her accounting class that she cries in class.  - Her sister has had good response to lexapro, per patient ROS: No active SI/HI; no decreased appetite  Social: Smokes marijuana. Denies other drug use. Does not drink EtOH.  No Known Allergies  Objective: Blood pressure 102/78, pulse 83, temperature 98.3 F (36.8 C), temperature source Oral, weight 131 lb (59.4 kg), last menstrual period 01/09/2017, SpO2 99 %. Body mass index is 21.14 kg/m. Constitutional: Well-appearing female in NAD Cardiovascular: RRR, S1, S2, no m/r/g.  Pulmonary/Chest: No respiratory distress.  Neurological: AOx3, no focal deficits. Skin: Skin is warm and dry. Excoriated areas of lateral sides of bilateral thumbs. Psychiatric: Somewhat anxious affect.  Vitals reviewed  Depression screen [facility] 2/9 01/09/2017 09/24/2016 08/28/2016  Decreased Interest 1 0 0  Down, Depressed, Hopeless 2 0 0  PHQ - 2 Score 3 0 0  Altered sleeping 3 - -  Tired, decreased energy 1 - -  Change in appetite 1 - -  Feeling bad or failure about yourself  1  - -  Trouble concentrating 2 - -  Moving slowly or fidgety/restless 2 - -  Suicidal thoughts 2 - -  PHQ-9 Score 15 - -  Difficult doing work/chores - - -   GAD 7 : Generalized Anxiety Score 01/09/2017  Nervous, Anxious, on Edge 3  Control/stop worrying 3  Worry too much - different things 3  Trouble relaxing 3  Restless 1  Easily annoyed or irritable 3  Afraid - awful might happen 2  Total GAD 7 Score 18   Assessment/Plan: Mood disorder (HCC) - Patient with severe and moderately severe scores for anxiety and depression, respectively. - Will start lexapro 10 mg daily. Can increase to 20 mg if tolerates well after 1 week. Counseled to stop immediately if has worsening of mood.  - Patient to begin seeing school counselor to talk about stressors - Recommended cutting back on marijuana - Recommended trying to find some exercise she enjoys as a healthy form of distraction  Follow-up within 3 weeks.   Dani Gobble, MD [facility], PGY-3

## 2017-01-11 NOTE — Assessment & Plan Note (Addendum)
-   Patient with severe and moderately severe scores for anxiety and depression, respectively. - Will start lexapro 10 mg daily. Can increase to 20 mg if tolerates well after 1 week. Counseled to stop immediately if has worsening of mood.  - Patient to begin seeing school counselor to talk about stressors - Recommended cutting back on marijuana - Recommended trying to find some exercise she enjoys as a healthy form of distraction

## 2017-01-29 ENCOUNTER — Encounter: Payer: Self-pay | Admitting: Family Medicine

## 2017-01-29 ENCOUNTER — Ambulatory Visit (INDEPENDENT_AMBULATORY_CARE_PROVIDER_SITE_OTHER): Payer: BLUE CROSS/BLUE SHIELD | Admitting: Family Medicine

## 2017-01-29 VITALS — BP 110/64 | HR 72 | Temp 98.5°F | Ht 66.5 in | Wt 137.0 lb

## 2017-01-29 DIAGNOSIS — L739 Follicular disorder, unspecified: Secondary | ICD-10-CM

## 2017-01-29 MED ORDER — MUPIROCIN CALCIUM 2 % EX CREA: 1.0000 "application " | TOPICAL_CREAM | Freq: Two times a day (BID) | CUTANEOUS | 0 refills | Status: AC

## 2017-01-29 MED ORDER — CEPHALEXIN 500 MG PO CAPS: 500.0000 mg | ORAL_CAPSULE | Freq: Four times a day (QID) | ORAL | 0 refills | Status: AC

## 2017-01-29 NOTE — Patient Instructions (Signed)

## 2017-01-29 NOTE — Progress Notes (Signed)
Subjective:     Patient ID: Natasha Garcia, female   DOB: Sep 07, 1990, 26 y.o.   MRN: [id number]  Rash  This is a new problem. The current episode started in the past 7 days. The problem has been gradually worsening (Started off on the back of her right calf and now it has moved to her left thigh) since onset. Location: localized on the back of her left thigh. The one on the right calf healed and resolved. The rash is characterized by blistering, draining and itchiness (Vesicles). Pertinent negatives include no cough, diarrhea, fever or vomiting. Treatments tried: She was placed on Doxycycline and Bactroban for the right calf rash which helped some. The treatment provided moderate relief. There is no history of allergies or eczema. (She has psoriasis, hx of herpes)  She denies chemical contact. She had HSV test done on previous lesion which was neg. 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 Current Outpatient Prescriptions on File Prior to Visit  Medication Sig Dispense Refill  . escitalopram (LEXAPRO) 10 MG tablet Take 1 tablet by mouth daily for 1 week. If you tolerate this well, you can increase the dose to 20 mg after at least 1 week. 60 tablet 0  . levonorgestrel (MIRENA) 20 MCG/24HR IUD 1 each by Intrauterine route once. Inserted 07/15/12    . doxycycline (VIBRA-TABS) 100 MG tablet Take 1 tablet (100 mg total) by mouth 2 (two) times daily. 20 tablet 0  . mupirocin cream (BACTROBAN) 2 % Apply 1 application topically 2 (two) times daily. 30 g 0  . mupirocin ointment (BACTROBAN) 2 % Apply 1 application topically 3 (three) times daily. 22 g 1  . triamcinolone ointment (KENALOG) 0.5 % Apply 1 application topically 2 (two) times daily. 30 g 1  . valACYclovir (VALTREX) 500 MG tablet Take 1 tablet (500 mg total) by mouth 2 (two) times daily. (Patient not taking: Reported on 01/29/2017) 6 tablet 11   No current facility-administered medications on  file prior to visit.    Past Medical History:  Diagnosis Date  . Anxiety   . Hx of herpes genitalis   . No pertinent past medical history    Vitals:   01/29/17 0918  BP: 110/64  Pulse: 72  Temp: 98.5 F (36.9 C)  TempSrc: Oral  Weight: 137 lb (62.1 kg)  Height: 5' 6.5" (1.689 m)     Review of Systems  Constitutional: Negative for fever.  Respiratory: Negative for cough.   Gastrointestinal: Negative for diarrhea and vomiting.  Musculoskeletal: Negative.   Skin: Positive for rash.  All other systems reviewed and are negative.      Objective:   Physical Exam  Constitutional: She is oriented to person, place, and time. She appears well-developed. No distress.  Cardiovascular: Normal rate, regular rhythm and normal heart sounds.   No murmur heard. Pulmonary/Chest: Effort normal and breath sounds normal. No respiratory distress. She has no wheezes.  Neurological: She is alert and oriented to person, place, and time.  Skin:     Nursing note and vitals reviewed.          Assessment:     Folliculitis.     Plan:     I reviewed her previous note and image for the previous rash she had in May which is similar to this one. HSV was checked then and it was neg for HSV 1&2. She was treated with A/B for her previous rash which was  helpful. Likely due to contact with patient with infections in the hospital. Hand hygiene discussed. Start Keflex and topical bactroban. I e-scribed her meds. F/U as needed.    Previous rash:

## 2017-01-31 ENCOUNTER — Telehealth: Payer: Self-pay

## 2017-01-31 NOTE — Telephone Encounter (Signed)
Page, patient called back and said that she thinks that the fax number is [phone] Attention: Winnifred Friarngela Bircham.Simpson, Michelle R

## 2017-01-31 NOTE — Telephone Encounter (Signed)
[facility] stating that she received a flu shot. Patient says that it was not received and she wants it refaxed. She needs to give the fax number in order for Page to refax.Glennie HawkSimpson, Madai Nuccio R

## 2017-02-01 NOTE — Telephone Encounter (Signed)
Faxed, again.

## 2017-02-04 NOTE — Telephone Encounter (Signed)
Pt called. Fax never been received. Explained fax wasn't working on Fri. Cannot find fax in front office. Pt wants to pick up copy this afternoon. Fax needs to be sent again.

## 2017-02-11 ENCOUNTER — Telehealth: Payer: Self-pay | Admitting: Family Medicine

## 2017-02-11 ENCOUNTER — Other Ambulatory Visit: Payer: Self-pay | Admitting: Internal Medicine

## 2017-02-11 NOTE — Telephone Encounter (Signed)
Patient was supposed to follow up for mood 3 weeks after starting this medication She was seen for a same day issue for a leg rash, but not for mood I attempted to call patient check on her mood before refilling. No answer. LVM asking her to call the clinic back. Will not refill until we hear from patient.   Latrelle DodrillBrittany J Braeson Rupe, MD

## 2017-02-11 NOTE — Telephone Encounter (Signed)
Patient was supposed to follow up for mood 3 weeks after starting lexapro. I received a refill request today.  I attempted to call patient check on her mood before refilling. No answer. LVM asking her to call the clinic back. Will not refill until we hear from patient.   Latrelle DodrillBrittany J [place], MD

## 2017-02-12 NOTE — Telephone Encounter (Signed)
Returned call to patient.  Appt made for 03/15/17 with Dr. Pollie MeyerMcIntyre (first available).  Would like enough meds to last until then.  Will forward to MD.   Natasha Garcia, Maryjo RochesterJessica Dawn, CMA

## 2017-02-13 NOTE — Telephone Encounter (Signed)
Ok. I will send in a prescription. Thanks Latrelle DodrillBrittany J Marianita Botkin, MD

## 2017-03-13 ENCOUNTER — Emergency Department (HOSPITAL_COMMUNITY): Payer: BLUE CROSS/BLUE SHIELD

## 2017-03-13 ENCOUNTER — Encounter (HOSPITAL_COMMUNITY): Payer: Self-pay | Admitting: Emergency Medicine

## 2017-03-13 ENCOUNTER — Emergency Department (HOSPITAL_COMMUNITY)
Admission: EM | Admit: 2017-03-13 | Discharge: 2017-03-13 | Disposition: A | Discharge status: Home / Self Care | Payer: BLUE CROSS/BLUE SHIELD | Attending: Emergency Medicine | Admitting: Emergency Medicine

## 2017-03-13 ENCOUNTER — Other Ambulatory Visit: Payer: Self-pay

## 2017-03-13 DIAGNOSIS — Y92410 Unspecified street and highway as the place of occurrence of the external cause: Secondary | ICD-10-CM | POA: Insufficient documentation

## 2017-03-13 DIAGNOSIS — Y999 Unspecified external cause status: Secondary | ICD-10-CM | POA: Insufficient documentation

## 2017-03-13 DIAGNOSIS — M545 Low back pain: Secondary | ICD-10-CM | POA: Diagnosis not present

## 2017-03-13 DIAGNOSIS — Z79899 Other long term (current) drug therapy: Secondary | ICD-10-CM | POA: Diagnosis not present

## 2017-03-13 DIAGNOSIS — Y9389 Activity, other specified: Secondary | ICD-10-CM | POA: Diagnosis not present

## 2017-03-13 LAB — I-STAT BETA HCG BLOOD, ED (MC, WL, AP ONLY)

## 2017-03-13 MED ORDER — CYCLOBENZAPRINE HCL 10 MG PO TABS: 10.0000 mg | ORAL_TABLET | Freq: Two times a day (BID) | ORAL | 0 refills | Status: DC | PRN

## 2017-03-13 MED ORDER — KETOROLAC TROMETHAMINE 30 MG/ML IJ SOLN
15.0000 mg | Freq: Once | INTRAMUSCULAR | Status: AC
  Administered 2017-03-13: 15 mg via INTRAMUSCULAR
  Filled 2017-03-13: qty 1

## 2017-03-13 MED ORDER — NAPROXEN 500 MG PO TABS: 500.0000 mg | ORAL_TABLET | Freq: Two times a day (BID) | ORAL | 0 refills | Status: DC
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
## 2017-03-13 NOTE — Discharge Instructions (Signed)
Please read the attached information regarding your condition. Take naproxen and Flexeril as needed for spasms. Apply heating pad and stretch areas as tolerated. Return to ED for worsening pain, additional injuries, numbness in arms or legs, loss of bladder function, chest pain or shortness of breath.
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
## 2017-03-15 ENCOUNTER — Telehealth: Payer: Self-pay | Admitting: Family Medicine

## 2017-03-15 ENCOUNTER — Ambulatory Visit: Payer: BLUE CROSS/BLUE SHIELD | Admitting: Family Medicine

## 2017-03-15 ENCOUNTER — Encounter: Payer: Self-pay | Admitting: Family Medicine

## 2017-03-15 ENCOUNTER — Other Ambulatory Visit: Payer: Self-pay

## 2017-03-15 VITALS — BP 110/78 | HR 70 | Temp 97.6°F | Ht 66.5 in | Wt 136.2 lb

## 2017-03-15 DIAGNOSIS — F39 Unspecified mood [affective] disorder: Secondary | ICD-10-CM

## 2017-03-15 DIAGNOSIS — M549 Dorsalgia, unspecified: Secondary | ICD-10-CM | POA: Diagnosis not present

## 2017-03-15 MED ORDER — ESCITALOPRAM OXALATE 20 MG PO TABS: 20.0000 mg | ORAL_TABLET | Freq: Every day | ORAL | 0 refills | Status: DC

## 2017-03-15 NOTE — Telephone Encounter (Signed)
We meant to do this before patient left her visit this morning, but forgot. I have written a note saying she should be on light duty for the next 2 weeks and will fax it. Attempted to call patient to discuss but she did not answer  Natasha DodrillBrittany J Ihor Meinzer, MD

## 2017-03-15 NOTE — Telephone Encounter (Signed)
Pt needs drs note for todays visit. Please fax to [phone]  Sampson SiAngela burcham  [facility].
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
## 2017-03-15 NOTE — Patient Instructions (Addendum)
It was nice to meet you today!  Sent in refill on lexapro Follow up on mood in 3 months   Note written for light duty for 2 weeks Call for a chiropractor appointment  For pain, take one pill of ibuprofen (200mg ) and one pill of tylenol (500mg ) Take both pills at the same time. Do this every 6 hours. Continue flexeril  Schedule appointment for pap smear & IUD removal whenever is convenient for you.  Be well, Dr. Pollie MeyerMcIntyre

## 2017-03-20 NOTE — Assessment & Plan Note (Signed)
Patient reports feeling much better since starting the Lexapro in October, but scored 17 and 18 on GAD7 and PHQ9. She has no active SI or HI and contracts for safety, states she will return to our office if she has serious thoughts of hurting herself. Protective factor is her daughter. No active plan to harm herself. - Refilled Lexapro 20 mg - Encouraged her to call her college's counseling center for an appointment, she states she will do this today. - follow up in 3 months, repeat of PHQ9 and GAD7 at next visit.

## 2017-03-21 NOTE — Telephone Encounter (Signed)
Employer wants to know the exact pounds she can lift.  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  No lifting or pulling over 40 lbs

## 2017-03-22 ENCOUNTER — Encounter: Payer: Self-pay | Admitting: *Deleted

## 2017-03-22 NOTE — Telephone Encounter (Signed)
Pt has an appt with her employer at 3 today. She needs this letter by 2 today.  If she doesn't go to work today, then she could be fired

## 2017-03-22 NOTE — Telephone Encounter (Signed)
Pt called to see if the new dr note was left up front for her to pick up. It's not up front and she would like to be able to go back to work today. She cant return back to work without this dr note. She wants a dr note left up front asap today so she can go to work today. Please advise

## 2017-03-22 NOTE — Telephone Encounter (Signed)
I tried to call patient to discuss but she did not answer. LVM asking her to call back. At this point it is after 5pm and our office is closed. I have been working in the hospital all day and just saw this message, which was sent to me this morning. I am happy to fax a letter to her employer if that would be helpful.  Latrelle DodrillBrittany J McIntyre, MD

## 2017-03-22 NOTE — Telephone Encounter (Signed)
Letter provided with work restrictions per Dr. Pollie MeyerMcIntyre and faxed per patient's request to Sampson SiAngela Burcham at [phone].  Called and left message to call our office back if she has questions or concerns.  Altamese Dilling~Jeannette Richardson, BSN, RN-BC

## 2017-03-22 NOTE — Telephone Encounter (Signed)
This encounter was created in error - please disregard.  This encounter was created in error - please disregard.

## 2017-03-22 NOTE — Telephone Encounter (Signed)
Discussed with Altamese DillingJeannette Richardson - I went ahead and printed the letter and she is going to fax it over to the employer's # that patient provided last week. She will call patient & let her know we've done this.  I am also going to be working tomorrow in the hospital, so if the on-call residents get a call tonight from patient and she wants to come by and pick up the letter from me at the hospital tomorrow, we may be able to arrange this. I certainly do not want her to lose her job.  Latrelle DodrillBrittany J Brandom Kerwin, MD

## 2017-04-18 ENCOUNTER — Other Ambulatory Visit: Payer: Self-pay | Admitting: Family Medicine

## 2017-04-18 MED ORDER — ESCITALOPRAM OXALATE 20 MG PO TABS
20.0000 mg | ORAL_TABLET | Freq: Every day | ORAL | 0 refills | Status: DC
Start: 2017-04-18 — End: 2017-11-15

## 2017-06-02 ENCOUNTER — Encounter (HOSPITAL_COMMUNITY): Payer: Self-pay | Admitting: Emergency Medicine

## 2017-06-02 ENCOUNTER — Other Ambulatory Visit: Payer: Self-pay

## 2017-06-02 ENCOUNTER — Ambulatory Visit (HOSPITAL_COMMUNITY)
Admission: EM | Admit: 2017-06-02 | Discharge: 2017-06-02 | Disposition: A | Discharge status: Home / Self Care | Payer: BLUE CROSS/BLUE SHIELD | Attending: Family Medicine | Admitting: Family Medicine

## 2017-06-02 DIAGNOSIS — Z791 Long term (current) use of non-steroidal anti-inflammatories (NSAID): Secondary | ICD-10-CM | POA: Diagnosis not present

## 2017-06-02 DIAGNOSIS — F1220 Cannabis dependence, uncomplicated: Secondary | ICD-10-CM | POA: Insufficient documentation

## 2017-06-02 DIAGNOSIS — Z975 Presence of (intrauterine) contraceptive device: Secondary | ICD-10-CM | POA: Insufficient documentation

## 2017-06-02 DIAGNOSIS — R21 Rash and other nonspecific skin eruption: Secondary | ICD-10-CM | POA: Diagnosis not present

## 2017-06-02 DIAGNOSIS — Z809 Family history of malignant neoplasm, unspecified: Secondary | ICD-10-CM | POA: Diagnosis not present

## 2017-06-02 DIAGNOSIS — N898 Other specified noninflammatory disorders of vagina: Secondary | ICD-10-CM | POA: Diagnosis not present

## 2017-06-02 DIAGNOSIS — R102 Pelvic and perineal pain: Secondary | ICD-10-CM | POA: Insufficient documentation

## 2017-06-02 DIAGNOSIS — Z202 Contact with and (suspected) exposure to infections with a predominantly sexual mode of transmission: Secondary | ICD-10-CM | POA: Insufficient documentation

## 2017-06-02 DIAGNOSIS — M419 Scoliosis, unspecified: Secondary | ICD-10-CM | POA: Insufficient documentation

## 2017-06-02 DIAGNOSIS — Z79899 Other long term (current) drug therapy: Secondary | ICD-10-CM | POA: Diagnosis not present

## 2017-06-02 DIAGNOSIS — Z113 Encounter for screening for infections with a predominantly sexual mode of transmission: Secondary | ICD-10-CM

## 2017-06-02 DIAGNOSIS — F419 Anxiety disorder, unspecified: Secondary | ICD-10-CM | POA: Insufficient documentation

## 2017-06-02 DIAGNOSIS — Z711 Person with feared health complaint in whom no diagnosis is made: Secondary | ICD-10-CM

## 2017-06-02 MED ORDER — METRONIDAZOLE 500 MG PO TABS: 500.0000 mg | ORAL_TABLET | Freq: Two times a day (BID) | ORAL | 0 refills | Status: AC

## 2017-06-02 MED ORDER — LIDOCAINE HCL (PF) 1 % IJ SOLN
INTRAMUSCULAR | Status: AC
  Filled 2017-06-02: qty 2

## 2017-06-02 MED ORDER — CEFTRIAXONE SODIUM 250 MG IJ SOLR
250.0000 mg | Freq: Once | INTRAMUSCULAR | Status: AC
  Administered 2017-06-02: 250 mg via INTRAMUSCULAR

## 2017-06-02 MED ORDER — CEFTRIAXONE SODIUM 250 MG IJ SOLR
INTRAMUSCULAR | Status: AC
  Filled 2017-06-02: qty 250

## 2017-06-02 MED ORDER — AZITHROMYCIN 250 MG PO TABS
1000.0000 mg | ORAL_TABLET | Freq: Once | ORAL | Status: AC
  Administered 2017-06-02: 1000 mg via ORAL

## 2017-06-02 MED ORDER — AZITHROMYCIN 250 MG PO TABS
ORAL_TABLET | ORAL | Status: AC
  Filled 2017-06-02: qty 4

## 2017-06-02 NOTE — ED Provider Notes (Signed)
[facility]    CSN: [id number] Arrival date & time: 06/02/17  1727     History   Chief Complaint Chief Complaint  Patient presents with  . Vaginal Pain    HPI Natasha Garcia is a 27 y.o. female.   Natasha Garcia presents with complaints of vaginal discomfort which started a few days ago, she feels that it is internal vaginal pain and maybe some swelling. Denies abdominal pain, back pain, fevers, urinary symptoms. States she has had trichomonas in the past and this feels somewhat similar. She has been on her period so unable to see her vaginal discharge. She denies vaginal itching. She was recently sexually active with a new partner without condoms and she is concerned about std exposure. She has a history of herpes, states this does not feel similar. She has an IUD.    ROS per HPI.       Past Medical History:  Diagnosis Date  . Anxiety   . Hx of herpes genitalis   . No pertinent past medical history     Patient Active Problem List   Diagnosis Date Noted  . Mood disorder (HCC) 01/11/2017  . Contraception management 11/19/2016  . Folliculitis 11/19/2016  . Rash and nonspecific skin eruption 08/28/2016  . Trichomonal vaginitis 02/03/2016  . Marijuana dependence (HCC) 01/27/2016  . Alopecia areata 01/06/2016  . 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 Episodic mood disorder (HCC) 05/10/2014  . Cervical high risk HPV (human papillomavirus) test positive 04/21/2014  . Possible exposure to STD 04/19/2014  . Paroxysmal supraventricular tachycardia (HCC) 01/18/2014  . Pityriasis 01/01/2013  . SCOLIOSIS, MILD 06/06/2007    Past Surgical History:  Procedure Laterality Date  . NO PAST SURGERIES      OB History    Gravida Para Term Preterm AB Living   1             SAB TAB Ectopic Multiple Live Births                   Home Medications    Prior to Admission medications   Medication Sig Start Date End Date Taking? Authorizing Provider  escitalopram  (LEXAPRO) 20 MG tablet Take 1 tablet (20 mg total) by mouth daily. 04/18/17  Yes Latrelle DodrillMcIntyre, Brittany J, MD  cyclobenzaprine (FLEXERIL) 10 MG tablet Take 1 tablet (10 mg total) by mouth 2 (two) times daily as needed for muscle spasms. Patient not taking: Reported on 06/02/2017 03/13/17   Dietrich PatesKhatri, Hina, PA-C  levonorgestrel (MIRENA) 20 MCG/24HR IUD 1 each by Intrauterine route once. Inserted 07/15/12    [provider]  metroNIDAZOLE (FLAGYL) 500 MG tablet Take 1 tablet (500 mg total) by mouth 2 (two) times daily for 7 days. 06/02/17 06/09/17  Georgetta HaberBurky, Jex Strausbaugh B, NP  naproxen (NAPROSYN) 500 MG tablet Take 1 tablet (500 mg total) by mouth 2 (two) times daily. Patient not taking: Reported on 06/02/2017 03/13/17   Dietrich PatesKhatri, Hina, PA-C  triamcinolone ointment (KENALOG) 0.5 % Apply 1 application topically 2 (two) times daily. Patient not taking: Reported on 06/02/2017 09/24/16   Bonney AidHaney, Alyssa A, MD  valACYclovir (VALTREX) 500 MG tablet Take 1 tablet (500 mg total) by mouth 2 (two) times daily. Patient not taking: Reported on 01/29/2017 12/14/16   Palma HolterGunadasa, Kanishka G, MD    Family History Family History  Problem Relation Age of Onset  . Cancer Maternal Grandmother   . Diabetes Neg Hx   . Heart disease Neg Hx   .  Stroke Neg Hx   . Anesthesia problems Neg Hx     Social History Social History   Tobacco Use  . Smoking status: Never Smoker  . Smokeless tobacco: Never Used  Substance Use Topics  . Alcohol use: No    Alcohol/week: 0.0 oz    Comment: not normally  . Drug use: Yes    Types: Marijuana     Allergies   Patient has no known allergies.   Review of Systems Review of Systems   Physical Exam Triage Vital Signs ED Triage Vitals [06/02/17 1753]  Enc Vitals Group     BP 119/82     Pulse Rate (!) 109     Resp 18     Temp 98.5 F (36.9 C)     Temp src      SpO2 100 %     Weight      Height      Head Circumference      Peak Flow      Pain Score 0     Pain Loc      Pain Edu?       Excl. in GC?    No data found.  Updated Vital Signs BP 119/82   Pulse (!) 109   Temp 98.5 F (36.9 C)   Resp 18   LMP 06/02/2017   SpO2 100%   Visual Acuity Right Eye Distance:   Left Eye Distance:   Bilateral Distance:    Right Eye Near:   Left Eye Near:    Bilateral Near:     Physical Exam  Constitutional: She is oriented to person, place, and time. She appears well-developed and well-nourished. No distress.  Cardiovascular: Normal rate, regular rhythm and normal heart sounds.  Pulmonary/Chest: Effort normal and breath sounds normal.  Abdominal: Soft. She exhibits no distension. There is no tenderness.  Genitourinary:  Genitourinary Comments: Without visible lesions or sores to vulva; thin white/yellow vaginal discharge coating noted on external exam; bimanual exam deferred at this time  Neurological: She is alert and oriented to person, place, and time.  Skin: Skin is warm and dry.     UC Treatments / Results  Labs (all labs ordered are listed, but only abnormal results are displayed) Labs Reviewed  CERVICOVAGINAL ANCILLARY ONLY    EKG  EKG Interpretation None       Radiology No results found.  Procedures Procedures (including critical care time)  Medications Ordered in UC Medications  azithromycin (ZITHROMAX) tablet 1,000 mg (not administered)  cefTRIAXone (ROCEPHIN) injection 250 mg (not administered)     Initial Impression / Assessment and Plan / UC Course  I have reviewed the triage vital signs and the nursing notes.  Pertinent labs & imaging results that were available during my care of the patient were reviewed by me and considered in my medical decision making (see chart for details).     Patient requesting complete std treatment due to potential exposure with new partner. Azithromycin, rocephin and flagyl provided empirically at this time. Std and vaginal testing collected and pending. Will notify of any positive findings and if any  changes to treatment are needed.  Withhold from intercourse for the next week. If symptoms worsen or do not improve in the next week to return to be seen or to follow up with PCP.  Patient verbalized understanding and agreeable to plan.   Final Clinical Impressions(s) / UC Diagnoses   Final diagnoses:  Concern about STD in female without diagnosis  Vaginal pain    ED Discharge Orders        Ordered    metroNIDAZOLE (FLAGYL) 500 MG tablet  2 times daily     06/02/17 1811       Controlled Substance Prescriptions [place] Controlled Substance Registry consulted? Not Applicable   Georgetta Haber, NP 06/02/17 1820

## 2017-06-02 NOTE — ED Triage Notes (Signed)
Pt states "I think I have an STD, my vagina area is sore, im on my period so I cant determine the color of my discharge."

## 2017-06-02 NOTE — Discharge Instructions (Signed)
We have treated you today for gonorrhea and chlamydia. Complete course of medications for trichomonas and bacterial vaginosis.  We will call you with any positive test findings and if any changes to medications are needed. If symptoms worsen or do not improve in the next week to return to be seen or to follow up with your PCP.   Please withhold from intercourse for the next week. Please use condoms to prevent STD's.

## 2017-06-03 LAB — CERVICOVAGINAL ANCILLARY ONLY
BACTERIAL VAGINITIS: POSITIVE — AB
CANDIDA VAGINITIS: NEGATIVE
CHLAMYDIA, DNA PROBE: NEGATIVE
NEISSERIA GONORRHEA: NEGATIVE
TRICH (WINDOWPATH): POSITIVE — AB

## 2017-06-07 ENCOUNTER — Ambulatory Visit: Payer: BLUE CROSS/BLUE SHIELD | Admitting: Family Medicine

## 2017-07-18 ENCOUNTER — Encounter: Payer: Self-pay | Admitting: Internal Medicine

## 2017-07-18 ENCOUNTER — Other Ambulatory Visit: Payer: Self-pay

## 2017-07-18 ENCOUNTER — Ambulatory Visit: Payer: BLUE CROSS/BLUE SHIELD | Admitting: Internal Medicine

## 2017-07-18 ENCOUNTER — Other Ambulatory Visit (HOSPITAL_COMMUNITY)
Admission: RE | Admit: 2017-07-18 | Discharge: 2017-07-18 | Disposition: A | Discharge status: Home / Self Care | Payer: BLUE CROSS/BLUE SHIELD | Source: Ambulatory Visit | Attending: Family Medicine | Admitting: Family Medicine

## 2017-07-18 ENCOUNTER — Inpatient Hospital Stay: Admission: RE | Admit: 2017-07-18 | Payer: Self-pay | Source: Ambulatory Visit

## 2017-07-18 VITALS — BP 90/60 | HR 83 | Temp 98.6°F | Ht 66.5 in | Wt 135.0 lb

## 2017-07-18 DIAGNOSIS — B379 Candidiasis, unspecified: Secondary | ICD-10-CM | POA: Diagnosis not present

## 2017-07-18 LAB — POCT WET PREP (WET MOUNT)
Clue Cells Wet Prep Whiff POC: NEGATIVE
Trichomonas Wet Prep HPF POC: ABSENT

## 2017-07-18 MED ORDER — FLUCONAZOLE 150 MG PO TABS: 150.0000 mg | ORAL_TABLET | Freq: Once | ORAL | 0 refills | Status: AC

## 2017-07-18 NOTE — Progress Notes (Signed)
   [facility] Noralee CharsAsiyah Cono Gebhard, MD Phone: [phone]  Reason For Visit: SDA for Vaginal Discharge   # VAGINAL DISCHARGE  Patient with several days of Keflex for a rash that she had previously.  She is now having curdy white discharge.  She states that it is very itchy.  She is sexually active however does not feel like she has any STDs.  Not been sexually active the last couple weeks and feels like this is more related to the Keflex  Symptoms Fever: None  Dysuria:None  Vaginal bleeding: None  Abdomen or Pelvic pain: None  Genital sores or ulcers:Does have hx of herpes, but no ulcers today Pain during sex: none  Missed menstrual period: none   Past Medical History Reviewed problem list.  Medications- reviewed and updated No additions to family history Social history- patient is a non smoker  Objective: BP 90/60   Pulse 83   Temp 98.6 F (37 C) (Oral)   Ht 5' 6.5" (1.689 m)   Wt 135 lb (61.2 kg)   SpO2 99%   BMI 21.46 kg/m  Gen: NAD, alert, cooperative with exam Cardio: regular rate and rhythm, S1S2 heard, no murmurs appreciated Pulm: clear to auscultation bilaterally, no wheezes, rhonchi or rales GU: external vaginal tissue wnl, vaginal vault with curd like discharge, cervix wnl, discharge from cervical os, no bleeding, no cervical motion tenderness, no abdominal/ adnexal masses Skin: dry, intact, no rashes or lesions  Assessment/Plan: See problem based a/p  Yeast infection Curd like discharge.  - fluconazole (DIFLUCAN) 150 MG tablet; Take 1 tablet (150 mg total) by mouth once for 1 dose.  Dispense: 1 tablet; Refill: 0 - Cervicovaginal ancillary only - POCT Wet Prep (Wet Mount) - Refused HIV and RPR -states she has had those recently and does not want them again

## 2017-07-18 NOTE — Patient Instructions (Addendum)
I am going to prescribe you,  Diflucan please pick this up from the pharmacy.  You only need to take 1 pill and that should clear up the infection. We will let you know if anything else is positive

## 2017-07-19 LAB — CERVICOVAGINAL ANCILLARY ONLY
Chlamydia: NEGATIVE
NEISSERIA GONORRHEA: NEGATIVE

## 2017-07-22 ENCOUNTER — Encounter: Payer: Self-pay | Admitting: Internal Medicine

## 2017-07-22 DIAGNOSIS — B379 Candidiasis, unspecified: Secondary | ICD-10-CM | POA: Insufficient documentation

## 2017-07-22 NOTE — Assessment & Plan Note (Addendum)
Curd like discharge.  - fluconazole (DIFLUCAN) 150 MG tablet; Take 1 tablet (150 mg total) by mouth once for 1 dose.  Dispense: 1 tablet; Refill: 0 - Cervicovaginal ancillary only - POCT Wet Prep (Wet Mount) - Refused HIV and RPR -states she has had those recently and does not want them again

## 2017-07-27 ENCOUNTER — Encounter: Payer: Self-pay | Admitting: Internal Medicine

## 2017-08-08 ENCOUNTER — Ambulatory Visit: Payer: BLUE CROSS/BLUE SHIELD | Admitting: Internal Medicine

## 2017-08-08 ENCOUNTER — Encounter: Payer: Self-pay | Admitting: Internal Medicine

## 2017-08-08 DIAGNOSIS — R21 Rash and other nonspecific skin eruption: Secondary | ICD-10-CM

## 2017-08-08 DIAGNOSIS — N926 Irregular menstruation, unspecified: Secondary | ICD-10-CM

## 2017-08-08 LAB — POCT URINE PREGNANCY: Preg Test, Ur: NEGATIVE

## 2017-08-08 MED ORDER — CEPHALEXIN 500 MG PO CAPS: 500.0000 mg | ORAL_CAPSULE | Freq: Four times a day (QID) | ORAL | 0 refills | Status: DC

## 2017-08-08 MED ORDER — MUPIROCIN 2 % EX OINT: 1.0000 "application " | TOPICAL_OINTMENT | Freq: Two times a day (BID) | CUTANEOUS | 0 refills | Status: DC

## 2017-08-08 MED ORDER — FLUCONAZOLE 150 MG PO TABS: 150.0000 mg | ORAL_TABLET | Freq: Once | ORAL | 0 refills | Status: AC

## 2017-08-08 NOTE — Progress Notes (Signed)
Subjective:   Patient: Natasha Garcia       Birthdate: [birth year]       MRN: [id number]      HPI  Natasha Garcia is a 27 y.o. female presenting for same day appt for leg rash and missed menses.   Leg rash At least fourth time this has occurred in past year. Typically occurs on lateral L thigh, which is where rash is located today. Last month occurred on R calf. When first appeared last year, patient was thought to have cellulitis/folliculitis, and was treated with Bactroban and eventually Keflex, which cleared infection. When rash appeared on calf last month, she used Bactroban and Keflex which she had left over, and rash cleared. She is now out of Keflex, so has not taken any since current rash appeared four days ago. Has been using Bactroban that she had left for the past four days, which she feels has kept rash from worsening, but has not cured it.  Patient says rash begins as erythematous area then progresses to clear fluid-filled vesicles, and finally pustular lesions. Currently rash is only erythematous. Prior to onset of redness, patient develops burning and tingling at the site of the rash. She does have history of HSV for which she has PRN Valtrex. Has taken Valtrex when noting tingling feeling, but does not prevent formation of rash. Pustules were swabbed when patient seen for this issue in 08/2016, and were negative for HSV or anything else.  Patient denies any new medications, prescription or OTC. Denies fevers, chills.   Missed menses Has Mirena that is about 25yrs old. Does have regular monthly periods, but is unsure if she had a period last month. Denies N/V, breast tenderness, vaginal discharge.   Smoking status reviewed. Patient is never smoker.   Review of Systems See HPI.     Objective:  Physical Exam  Constitutional: She is oriented to person, place, and time. She appears well-developed and well-nourished.  HENT:  Head: Normocephalic and atraumatic.  Cardiovascular:  Normal rate.  Pulmonary/Chest: Effort normal. No respiratory distress.  Neurological: She is alert and oriented to person, place, and time.  Skin:  Erythematous macule ~3cmx1.5cm located on L lateral thigh near buttocks. No vesicles, pustules, etc. Non-tender to palpation. Non-indurated. No surrounding erythema. Skin otherwise warm and dry.   Psychiatric: She has a normal mood and affect. Her behavior is normal.    Assessment & Plan:  Rash and nonspecific skin eruption Recurrent, with at least 4 episodes over past year. Most episodes located over L lateral thigh, with episode last month on R calf. Prodrome of pain and tingling consistent with shingles, however distribution is not along a dermatome, no improvement with Valtrex, and significant improvement with Bactroban and Keflex, so less likely shingles. Could be recurrent drug eruption, however patient denies taking any new OTC or prescription meds now or with previous outbreaks. Patient does have herpes, though swab of vesicular fluid last year neg for herpes virus, and would expect improvement with Valtrex if this was the cause. Dermatitis herpetiformis considered given appearance, however would expect itching with this, which patient does not have, and lesions are also not in distribution that would be expected with dermatitis herpetiformis. As patient has been using Bactroban for the past 4d, biopsy not likely useful at this point, however encouraged patient to call immediately to schedule appt if this occurs again and not to use any topical or oral medication so that punch biopsy can be obtained in office to  help determine etiology. As Bactroban and Keflex have been effective in the past, will treat with that again. Also given Diflucan as patient develops yeast infections with Keflex. F/u if no improvement.  Precepted with Dr. Deirdre Priest.   Concern for missed menses Unsure if menstruated last month or not. Has IUD in place. Upreg neg in office  today.   Tarri Abernethy, MD, MPH PGY-3 [facility] Pager [phone]

## 2017-08-08 NOTE — Assessment & Plan Note (Signed)
Recurrent, with at least 4 episodes over past year. Most episodes located over L lateral thigh, with episode last month on R calf. Prodrome of pain and tingling consistent with shingles, however distribution is not along a dermatome, no improvement with Valtrex, and significant improvement with Bactroban and Keflex, so less likely shingles. Could be recurrent drug eruption, however patient denies taking any new OTC or prescription meds now or with previous outbreaks. Patient does have herpes, though swab of vesicular fluid last year neg for herpes virus, and would expect improvement with Valtrex if this was the cause. Dermatitis herpetiformis considered given appearance, however would expect itching with this, which patient does not have, and lesions are also not in distribution that would be expected with dermatitis herpetiformis. As patient has been using Bactroban for the past 4d, biopsy not likely useful at this point, however encouraged patient to call immediately to schedule appt if this occurs again and not to use any topical or oral medication so that punch biopsy can be obtained in office to help determine etiology. As Bactroban and Keflex have been effective in the past, will treat with that again. Also given Diflucan as patient develops yeast infections with Keflex. F/u if no improvement.  Precepted with Dr. Deirdre Priest.

## 2017-08-08 NOTE — Patient Instructions (Signed)
It was nice meeting you today Bindi!  Please begin taking Keflex for your skin rash. Take all 28 tablets even if the rash starts to improve before you have finished all of them. You can continue using Bactroban (mupirocin) as you have been.   If the rash forms again, please schedule an appointment as soon as possible and do not use mupirocin or take any medications prior to your appointment.   If you have any questions or concerns, please feel free to call the clinic.   Be well,  Dr. Natale Milch

## 2017-08-20 ENCOUNTER — Encounter: Payer: Self-pay | Admitting: Internal Medicine

## 2017-08-20 ENCOUNTER — Ambulatory Visit (INDEPENDENT_AMBULATORY_CARE_PROVIDER_SITE_OTHER): Payer: BLUE CROSS/BLUE SHIELD | Admitting: Internal Medicine

## 2017-08-20 VITALS — BP 102/78 | HR 63 | Temp 98.2°F | Wt 133.8 lb

## 2017-08-20 DIAGNOSIS — Z30432 Encounter for removal of intrauterine contraceptive device: Secondary | ICD-10-CM | POA: Diagnosis not present

## 2017-08-20 DIAGNOSIS — Z30013 Encounter for initial prescription of injectable contraceptive: Secondary | ICD-10-CM | POA: Diagnosis not present

## 2017-08-20 MED ORDER — MEDROXYPROGESTERONE ACETATE 150 MG/ML IM SUSY
150.0000 mg | PREFILLED_SYRINGE | Freq: Once | INTRAMUSCULAR | Status: AC
  Administered 2017-08-20: 150 mg via INTRAMUSCULAR

## 2017-08-20 NOTE — Patient Instructions (Addendum)
It was nice seeing you again today Natasha Garcia!  The Depo shot you received today will give you birth control for the next three months. During this time, you can think about alternative birth control methods if this is what you desire, or you can continue to consider discontinuing birth control altogether and the possible consequences.   We will see you at your next appointment to have your IUD removed. If you have any questions in the meantime, please feel free to call the clinic.   Be well,  Dr. Natale Milch

## 2017-08-20 NOTE — Progress Notes (Signed)
   Subjective:   Patient: Natasha Garcia       Birthdate: 18-Jun-1990       MRN: [id number]      HPI  Natasha Garcia is a 27 y.o. female presenting for contraception management.   Contraception management Patient wishing to have Mirena removed. Was placed 07/15/2012. Does not wish to begin an alternative form of contraception. Is sexually active with one female partner. Is not currently using any form of contraception. Currently menstruating. Says she is not wanting to become pregnant, however she feels that hormonal contraception is unhealthy and she is trying to take better care of her body. Has received one Depo shot in the past prior to IUD insertion. Says she gained at least 10 pounds in the following month, so did not receive another injection.   Smoking status reviewed. Patient is never smoker.   Review of Systems See HPI.     Objective:  Physical Exam  Constitutional: She is oriented to person, place, and time. She appears well-developed and well-nourished. No distress.  HENT:  Head: Normocephalic and atraumatic.  Pulmonary/Chest: Effort normal. No respiratory distress.  Neurological: She is alert and oriented to person, place, and time.  Skin: Skin is warm and dry.  Psychiatric: She has a normal mood and affect. Her behavior is normal.   Assessment & Plan:  Contraception management Patient wanting to have IUD removed. Currently not wishing to begin another form of contraception, however is also sexually active and not wanting to become pregnant. Of note, patient seen two weeks ago for possible missed menses, and was very concerned at that time that she may be pregnant. Discussed with patient that remaining sexually active while not on any form of contraception greatly increases her risk of becoming pregnant. Also discussed the risks and benefits of hormonal birth control, as well as other contraceptive options than Mirena. Patient wishing to not have periods, so discussed trying  Depo again. Discussed weight gain misconceptions regarding Depo, and also that it may take a couple injections before significant changes in menstruation are observed. Patient amenable to receiving Depo today while she awaits appt for Mirena removal so she will have contraception while thinking about her options. Appt to be scheduled in procedures clinic or with PCP Dr. Pollie Meyer to have Mirena removed at her earliest convenience.  Precepted with Dr. Jennette Kettle.   Tarri Abernethy, MD, MPH PGY-3 [facility] Pager [phone]

## 2017-08-20 NOTE — Assessment & Plan Note (Signed)
Patient wanting to have IUD removed. Currently not wishing to begin another form of contraception, however is also sexually active and not wanting to become pregnant. Of note, patient seen two weeks ago for possible missed menses, and was very concerned at that time that she may be pregnant. Discussed with patient that remaining sexually active while not on any form of contraception greatly increases her risk of becoming pregnant. Also discussed the risks and benefits of hormonal birth control, as well as other contraceptive options than Mirena. Patient wishing to not have periods, so discussed trying Depo again. Discussed weight gain misconceptions regarding Depo, and also that it may take a couple injections before significant changes in menstruation are observed. Patient amenable to receiving Depo today while she awaits appt for Mirena removal so she will have contraception while thinking about her options. Appt to be scheduled in procedures clinic or with PCP Dr. Pollie Meyer to have Mirena removed at her earliest convenience.

## 2017-09-05 ENCOUNTER — Other Ambulatory Visit: Payer: Self-pay

## 2017-09-05 ENCOUNTER — Ambulatory Visit (INDEPENDENT_AMBULATORY_CARE_PROVIDER_SITE_OTHER): Payer: BLUE CROSS/BLUE SHIELD | Admitting: Family Medicine

## 2017-09-05 VITALS — BP 102/64 | HR 74 | Temp 98.4°F | Wt 135.0 lb

## 2017-09-05 DIAGNOSIS — Z30432 Encounter for removal of intrauterine contraceptive device: Secondary | ICD-10-CM | POA: Diagnosis not present

## 2017-09-05 NOTE — Progress Notes (Signed)
CC: "I want my IUD out because its been over 5 years"  HPI: Patient states she had a difficult time with implantation of Mirena. She also states she does not want any birth control after removal. Patient did have Depo shot before removal for 3 months of coverage.  PHYSICAL: No abnormal lesions on the vaginal mucosa. Cervix appearance normal with normal cervical os.  PRE-OP DIAGNOSIS:Mirena removal  POST-OP DIAGNOSIS: Same   PROCEDURE: IUD removal   Performing Physician: Dr. Jules Schick  Supervising Physician (if applicable): Dr. Lum Babe  PROCEDURE:  The speculum was placed and the IUD string visualized. Using ringed forceps, the IUD string was grasped and the device was removed without difficulty. Bleeding was minimal.  Followup: The patient tolerated the procedure well without complications. Standard post-procedure care is explained and return precautions are given.

## 2017-09-05 NOTE — Patient Instructions (Signed)
It was nice seeing you today. We were able to remove your Mirena without difficulty. Glad to know that you recently got your Depo shot. See [place] in about 2.5-3 months for next Depo shot if you desire to continue. See your PCP soon for other health concern.

## 2017-10-15 ENCOUNTER — Encounter (HOSPITAL_COMMUNITY): Payer: Self-pay | Admitting: Emergency Medicine

## 2017-10-15 ENCOUNTER — Emergency Department (HOSPITAL_COMMUNITY)
Admission: EM | Admit: 2017-10-15 | Discharge: 2017-10-15 | Disposition: A | Discharge status: Home / Self Care | Payer: BLUE CROSS/BLUE SHIELD | Attending: Emergency Medicine | Admitting: Emergency Medicine

## 2017-10-15 ENCOUNTER — Other Ambulatory Visit: Payer: Self-pay

## 2017-10-15 DIAGNOSIS — F1220 Cannabis dependence, uncomplicated: Secondary | ICD-10-CM | POA: Insufficient documentation

## 2017-10-15 DIAGNOSIS — F419 Anxiety disorder, unspecified: Secondary | ICD-10-CM | POA: Diagnosis not present

## 2017-10-15 DIAGNOSIS — R109 Unspecified abdominal pain: Secondary | ICD-10-CM | POA: Diagnosis present

## 2017-10-15 DIAGNOSIS — Z79899 Other long term (current) drug therapy: Secondary | ICD-10-CM | POA: Diagnosis not present

## 2017-10-15 LAB — COMPREHENSIVE METABOLIC PANEL
ALK PHOS: 48 U/L (ref 38–126)
ALT: 22 U/L (ref 0–44)
ANION GAP: 15 (ref 5–15)
AST: 20 U/L (ref 15–41)
Albumin: 4.2 g/dL (ref 3.5–5.0)
BILIRUBIN TOTAL: 0.6 mg/dL (ref 0.3–1.2)
BUN: 7 mg/dL (ref 6–20)
CALCIUM: 9.5 mg/dL (ref 8.9–10.3)
CO2: 20 mmol/L — ABNORMAL LOW (ref 22–32)
Chloride: 104 mmol/L (ref 98–111)
Creatinine, Ser: 0.75 mg/dL (ref 0.44–1.00)
GLUCOSE: 92 mg/dL (ref 70–99)
Potassium: 3.8 mmol/L (ref 3.5–5.1)
Sodium: 139 mmol/L (ref 135–145)
TOTAL PROTEIN: 7 g/dL (ref 6.5–8.1)

## 2017-10-15 LAB — URINALYSIS, ROUTINE W REFLEX MICROSCOPIC
BILIRUBIN URINE: NEGATIVE
Glucose, UA: NEGATIVE mg/dL
HGB URINE DIPSTICK: NEGATIVE
Ketones, ur: NEGATIVE mg/dL
NITRITE: NEGATIVE
Protein, ur: NEGATIVE mg/dL
SPECIFIC GRAVITY, URINE: 1.005 (ref 1.005–1.030)
pH: 7 (ref 5.0–8.0)

## 2017-10-15 LAB — CBC
HCT: 39.1 % (ref 36.0–46.0)
HEMOGLOBIN: 13.1 g/dL (ref 12.0–15.0)
MCH: 30.5 pg (ref 26.0–34.0)
MCHC: 33.5 g/dL (ref 30.0–36.0)
MCV: 91.1 fL (ref 78.0–100.0)
Platelets: 200 10*3/uL (ref 150–400)
RBC: 4.29 MIL/uL (ref 3.87–5.11)
RDW: 11.7 % (ref 11.5–15.5)
WBC: 6.5 10*3/uL (ref 4.0–10.5)

## 2017-10-15 LAB — LIPASE, BLOOD: Lipase: 34 U/L (ref 11–51)

## 2017-10-15 LAB — I-STAT BETA HCG BLOOD, ED (MC, WL, AP ONLY)

## 2017-10-15 MED ORDER — PANTOPRAZOLE SODIUM 20 MG PO TBEC
20.0000 mg | DELAYED_RELEASE_TABLET | Freq: Every day | ORAL | 0 refills | Status: DC
Start: 2017-10-15 — End: 2020-11-05

## 2017-10-15 MED ORDER — GI COCKTAIL ~~LOC~~
30.0000 mL | Freq: Once | ORAL | Status: AC
Start: 2017-10-15 — End: 2017-10-15
  Administered 2017-10-15: 30 mL via ORAL
  Filled 2017-10-15: qty 30

## 2017-10-15 NOTE — Discharge Instructions (Signed)
Your work-up in the emergency department today was reassuring.  We recommend the use of daily Protonix.  Try to avoid frequent consumption of coffee, chocolate, citrus fruits, spicy foods.  Follow-up with your primary care doctor regarding your ED visit today.

## 2017-10-15 NOTE — ED Triage Notes (Signed)
Pt c/o intermittent abd pain, worse after eating x 2 days, +nausea.

## 2017-10-15 NOTE — ED Provider Notes (Signed)
[facility] EMERGENCY DEPARTMENT Provider Note   CSN: [id number] Arrival date & time: 10/15/17  0026     History   Chief Complaint Chief Complaint  Patient presents with  . Abdominal Pain    HPI Natasha Garcia is a 27 y.o. female.   27 year old female presents to the emergency department for evaluation of intermittent abdominal cramping over the past 2 days.  Symptoms aggravated by eating or drinking.  She has had some slight anorexia as a result.  Patient complaining of associated nausea.  She has not had any vomiting.  Denies any new medications or any sick contacts.  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  No medications taken for abdominal pain.  She denies fevers, diarrhea, urinary symptoms, vaginal complaints.  No history of abdominal surgeries.  No abdominal pain at the present time.     Past Medical History:  Diagnosis Date  . Anxiety   . Hx of herpes genitalis   . No pertinent past medical history     Patient Active Problem List   Diagnosis Date Noted  . Yeast infection 07/22/2017  . Mood disorder (HCC) 01/11/2017  . Contraception management 11/19/2016  . Folliculitis 11/19/2016  . Rash and nonspecific skin eruption 08/28/2016  . Trichomonal vaginitis 02/03/2016  . Marijuana dependence (HCC) 01/27/2016  . Alopecia areata 01/06/2016  . 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 Episodic mood disorder (HCC) 05/10/2014  . Cervical high risk HPV (human papillomavirus) test positive 04/21/2014  . Possible exposure to STD 04/19/2014  . Paroxysmal supraventricular tachycardia (HCC) 01/18/2014  . Pityriasis 01/01/2013  . SCOLIOSIS, MILD 06/06/2007    Past Surgical History:  Procedure Laterality Date  . NO PAST SURGERIES       OB History    Gravida  1   Para      Term      Preterm      AB      Living        SAB      TAB      Ectopic      Multiple      Live Births                Home Medications    Prior to Admission medications   Medication Sig Start Date End Date Taking? Authorizing Provider  cephALEXin (KEFLEX) 500 MG capsule Take 1 capsule (500 mg total) by mouth 4 (four) times daily. 08/08/17   Marquette Saa, MD  escitalopram (LEXAPRO) 20 MG tablet Take 1 tablet (20 mg total) by mouth daily. 04/18/17   Latrelle Dodrill, MD  levonorgestrel (MIRENA) 20 MCG/24HR IUD 1 each by Intrauterine route once. Inserted 07/15/12    [provider]  mupirocin ointment (BACTROBAN) 2 % Apply 1 application topically 2 (two) times daily. 08/08/17   Marquette Saa, MD  pantoprazole (PROTONIX) 20 MG tablet Take 1 tablet (20 mg total) by mouth daily. 10/15/17   Antony Madura, PA-C  valACYclovir (VALTREX) 500 MG tablet Take 1 tablet (500 mg total) by mouth 2 (two) times daily. Patient not taking: Reported on 01/29/2017 12/14/16   Palma Holter, MD    Family History Family History  Problem Relation Age of Onset  . Cancer Maternal Grandmother   . Diabetes Neg Hx   . Heart disease Neg Hx   . Stroke Neg Hx   . Anesthesia problems Neg Hx  Social History Social History   Tobacco Use  . Smoking status: Never Smoker  . Smokeless tobacco: Never Used  Substance Use Topics  . Alcohol use: Yes    Comment: occasiona  . Drug use: Yes    Types: Marijuana     Allergies   Patient has no known allergies.   Review of Systems Review of Systems Ten systems reviewed and are negative for acute change, except as noted in the HPI.    Physical Exam Updated Vital Signs BP 120/85   Pulse 73   Temp 98.7 F (37.1 C) (Oral)   Resp 20   Ht 5\' 6"  (1.676 m)   Wt 62.6 kg (138 lb)   LMP 09/07/2017   SpO2 100%   BMI 22.27 kg/m   Physical Exam  Constitutional: She is oriented to person, place, and time. She appears well-developed and well-nourished. No distress.  Nontoxic appearing and in no distress  HENT:  Head: Normocephalic and  atraumatic.  Eyes: Conjunctivae and EOM are normal. No scleral icterus.  Neck: Normal range of motion.  Cardiovascular: Normal rate, regular rhythm and intact distal pulses.  Pulmonary/Chest: Effort normal. No stridor. No respiratory distress. She has no wheezes. She has no rales.  Respirations even and unlabored  Abdominal: Soft. She exhibits no distension and no mass. There is no tenderness. There is no guarding.  Soft, nontender, nondistended  Musculoskeletal: Normal range of motion.  Neurological: She is alert and oriented to person, place, and time. She exhibits normal muscle tone. Coordination normal.  Skin: Skin is warm and dry. No rash noted. She is not diaphoretic. No erythema. No pallor.  Psychiatric: She has a normal mood and affect. Her behavior is normal.  Nursing note and vitals reviewed.    ED Treatments / Results  Labs (all labs ordered are listed, but only abnormal results are displayed) Labs Reviewed  COMPREHENSIVE METABOLIC PANEL - Abnormal; Notable for the following components:      Result Value   CO2 20 (*)    All other components within normal limits  URINALYSIS, ROUTINE W REFLEX MICROSCOPIC - Abnormal; Notable for the following components:   Color, Urine STRAW (*)    Leukocytes, UA LARGE (*)    Bacteria, UA RARE (*)    All other components within normal limits  LIPASE, BLOOD  CBC  I-STAT BETA HCG BLOOD, ED ([facility], [facility], [facility] ONLY)    EKG None  Radiology No results found.  Procedures Procedures (including critical care time)  Medications Ordered in ED Medications  gi cocktail (Maalox,Lidocaine,Donnatal) (30 mLs Oral Given 10/15/17 0440)     Initial Impression / Assessment and Plan / ED Course  I have reviewed the triage vital signs and the nursing notes.  Pertinent labs & imaging results that were available during my care of the patient were reviewed by me and considered in my medical decision making (see chart for details).     27 year old female  presents for intermittent abdominal cramping.  This has been slightly aggravated with eating, though gallbladder etiology felt less likely in absence of leukocytosis, fever, liver function test abnormalities.  Patient also has no focal right upper quadrant tenderness.  Negative Murphy sign.  Abdomen is, overall, soft and nontender.  No peritoneal signs present.  Remainder of laboratory work-up is been reassuring.  Patient reports increased consumption of coffee.  It is possible that her symptoms may be brought on by developing gastritis.  She was given a GI cocktail in the emergency department.  Plan for discharge with Protonix Rx.  I do not believe further emergent work-up or imaging is indicated at this time.  Will refer to primary care for follow-up.  Return precautions discussed and provided. Patient discharged in stable condition with no unaddressed concerns.   Final Clinical Impressions(s) / ED Diagnoses   Final diagnoses:  Abdominal pain, unspecified abdominal location    ED Discharge Orders        Ordered    pantoprazole (PROTONIX) 20 MG tablet  Daily     10/15/17 0442       Antony MaduraHumes, Mada Sadik, PA-C 10/15/17 0449    Nira Connardama, Pedro Eduardo, MD 10/15/17 [id number]

## 2017-10-16 ENCOUNTER — Ambulatory Visit (INDEPENDENT_AMBULATORY_CARE_PROVIDER_SITE_OTHER): Payer: BLUE CROSS/BLUE SHIELD | Admitting: Family Medicine

## 2017-10-16 ENCOUNTER — Encounter: Payer: Self-pay | Admitting: Family Medicine

## 2017-10-16 ENCOUNTER — Other Ambulatory Visit (HOSPITAL_COMMUNITY)
Admission: RE | Admit: 2017-10-16 | Discharge: 2017-10-16 | Disposition: A | Discharge status: Home / Self Care | Payer: BLUE CROSS/BLUE SHIELD | Source: Ambulatory Visit | Attending: Family Medicine | Admitting: Family Medicine

## 2017-10-16 ENCOUNTER — Other Ambulatory Visit: Payer: Self-pay

## 2017-10-16 VITALS — BP 110/72 | HR 93 | Temp 98.5°F | Ht 66.0 in | Wt 134.0 lb

## 2017-10-16 DIAGNOSIS — N898 Other specified noninflammatory disorders of vagina: Secondary | ICD-10-CM

## 2017-10-16 DIAGNOSIS — B3731 Acute candidiasis of vulva and vagina: Secondary | ICD-10-CM

## 2017-10-16 DIAGNOSIS — Z124 Encounter for screening for malignant neoplasm of cervix: Secondary | ICD-10-CM | POA: Diagnosis not present

## 2017-10-16 DIAGNOSIS — B373 Candidiasis of vulva and vagina: Secondary | ICD-10-CM

## 2017-10-16 LAB — POCT UA - MICROSCOPIC ONLY

## 2017-10-16 LAB — POCT WET PREP (WET MOUNT)
CLUE CELLS WET PREP WHIFF POC: NEGATIVE
Trichomonas Wet Prep HPF POC: ABSENT

## 2017-10-16 LAB — POCT URINALYSIS DIP (MANUAL ENTRY)
BILIRUBIN UA: NEGATIVE
Blood, UA: NEGATIVE
GLUCOSE UA: NEGATIVE mg/dL
Ketones, POC UA: NEGATIVE mg/dL
NITRITE UA: NEGATIVE
Protein Ur, POC: NEGATIVE mg/dL
Spec Grav, UA: 1.01 (ref 1.010–1.025)
Urobilinogen, UA: 0.2 E.U./dL
pH, UA: 7 (ref 5.0–8.0)

## 2017-10-16 MED ORDER — FLUCONAZOLE 150 MG PO TABS: 150.0000 mg | ORAL_TABLET | Freq: Once | ORAL | 0 refills | Status: AC

## 2017-10-16 NOTE — Assessment & Plan Note (Signed)
Overdue for Pap smear.  Prior Pap with negative intraepithelial abnormality though tested positive for high-risk HPV. - Pap smear performed, will follow results

## 2017-10-16 NOTE — Patient Instructions (Addendum)
Thank you for coming in to see us today. Please see below to review our plan for today's visit.  You do have signs of a yeast infection.  This usually responds well with a single dose of fluconazole.  I will call you if there is any abnormalities with your other results including her Pap smear, otherwise you will receive results in the mail.  Please call the clinic at [phone] if your symptoms worsen or you have any concerns. It was our pleasure to serve you.  Durward Parcelavid McMullen, DO [facility], PGY-3

## 2017-10-16 NOTE — Progress Notes (Signed)
   Subjective   Patient ID: Natasha Garcia    DOB: 10/09/1990, 27 y.o. female   MRN: [id number]  CC: "STD check"  HPI: Natasha Riselexis M Richison is a 27 y.o. female who presents for a same day appointment for the following:  VAGINAL DISCHARGE  Having vaginal discharge for 3 days. Medications tried: none Discharge consistency: more than usual Discharge color: clear Recent antibiotic use: no Sex in last month: yes, used condoms Possible STD exposure: not sure  Symptoms Fever: no Dysuria: no Vaginal bleeding: no Abdomen or Pelvic pain: no Back pain: no Genital sores or ulcers: no Rash: no, last outbreak over 1 month ago Pain during sex: no Missed menstrual period: had Depo 1 month ago and no bleeding since  Of note, patient was seen in the emergency department yesterday for mild abdominal pain given Protonix for suspected gastroenteritis.  Patient does endorse oral intercourse.   ROS: see HPI for pertinent.  PMFSH: HSV-2, h/o trichomonas, episodic eating d/o, THC use, SVT, scoliosis. Surgical history unremarkable. Family history unremarkable. Smoking status reviewed. Medications reviewed.  Objective   BP 110/72   Pulse 93   Temp 98.5 F (36.9 C) (Oral)   Ht 5\' 6"  (1.676 m)   Wt 134 lb (60.8 kg)   SpO2 97%   BMI 21.63 kg/m  Vitals and nursing note reviewed.  General: well nourished, well developed, NAD with non-toxic appearance HEENT: normocephalic, atraumatic, moist mucous membranes, no pharyngeal erythema or tonsillar edema Cardiovascular: regular rate and rhythm without murmurs, rubs, or gallops Lungs: clear to auscultation bilaterally with normal work of breathing Abdomen: soft, non-tender, non-distended, normoactive bowel sounds GU: accompanied by chaperone, no external lesions or rash, thick white discharge appreciated, no odor, cervical loss nonfriable, no bleeding in vaginal vault, no adnexal tenderness Skin: warm, dry, no rashes or lesions, cap refill < 2  seconds Extremities: warm and well perfused, normal tone, no edema  Assessment & Plan   Vaginal discharge Acute.  Wet prep with few hyphae and many bacteria.  Vaginal candidiasis consistent with exam.  Will need to evaluate for GC/chlamydia.  Patient declined HIV and syphilis screen. - Checking GC/chlamydia - Will call patient regarding results - Reviewed return precautions  Screening for malignant neoplasm of cervix Overdue for Pap smear.  Prior Pap with negative intraepithelial abnormality though tested positive for high-risk HPV. - Pap smear performed, will follow results  Orders Placed This Encounter  Procedures  . POCT urinalysis dipstick  . POCT Wet Prep [employer](Wet [place])  . POCT UA - Microscopic Only   Meds ordered this encounter  Medications  . fluconazole (DIFLUCAN) 150 MG tablet    Sig: Take 1 tablet (150 mg total) by mouth once for 1 dose.    Dispense:  1 tablet    Refill:  0    Durward Parcelavid McMullen, DO [facility], PGY-3 10/16/2017, 5:20 PM

## 2017-10-16 NOTE — Assessment & Plan Note (Signed)
Acute.  Wet prep with few hyphae and many bacteria.  Vaginal candidiasis consistent with exam.  Will need to evaluate for GC/chlamydia.  Patient declined HIV and syphilis screen. - Checking GC/chlamydia - Will call patient regarding results - Reviewed return precautions

## 2017-10-18 LAB — CYTOLOGY - PAP: Diagnosis: NEGATIVE

## 2017-10-18 LAB — CERVICOVAGINAL ANCILLARY ONLY
Chlamydia: NEGATIVE
Neisseria Gonorrhea: NEGATIVE

## 2017-10-29 ENCOUNTER — Ambulatory Visit (INDEPENDENT_AMBULATORY_CARE_PROVIDER_SITE_OTHER): Payer: BLUE CROSS/BLUE SHIELD | Admitting: Family Medicine

## 2017-10-29 ENCOUNTER — Other Ambulatory Visit: Payer: Self-pay

## 2017-10-29 DIAGNOSIS — L210 Seborrhea capitis: Secondary | ICD-10-CM | POA: Diagnosis not present

## 2017-10-29 DIAGNOSIS — B373 Candidiasis of vulva and vagina: Secondary | ICD-10-CM | POA: Diagnosis not present

## 2017-10-29 DIAGNOSIS — B3731 Acute candidiasis of vulva and vagina: Secondary | ICD-10-CM

## 2017-10-29 MED ORDER — FLUCONAZOLE 150 MG PO TABS
150.0000 mg | ORAL_TABLET | Freq: Every day | ORAL | 0 refills | Status: DC
Start: 2017-10-29 — End: 2017-11-15

## 2017-10-29 MED ORDER — TRIAMCINOLONE ACETONIDE 0.1 % EX CREA: 1.0000 "application " | TOPICAL_CREAM | Freq: Two times a day (BID) | CUTANEOUS | 0 refills | Status: DC

## 2017-10-29 NOTE — Assessment & Plan Note (Addendum)
Workup from 7/10 negative and no known exposure to STDs since last visit.  Will not need further testing today. Exam notable for skin irritation and erythema externally likely 2/2 yeast vaginitis with small amount of thick white discharge present within vaginal vault.  -Rx: diflucan, 2 tabs -return if symptoms worsen or do not improve  -counseled on safe sex

## 2017-10-29 NOTE — Progress Notes (Addendum)
   Subjective:   Patient ID: Natasha Garcia    DOB: 09/09/1990, 27 y.o. female   MRN: [id number]  CC: rash, f/u yeast infection  HPI: Natasha Garcia is a 27 y.o. female who presents to clinic today for the following issues.  Recent yeast infection Seen by Dr. Abelardo DieselMcMullen on 7/10 for vaginal irritation and was found to have vaginal candidiasis.  Was treated with Diflucan and symptoms of vaginal irritation improved some but still having itching and discharge that is white and thick. She denies dysuria, frequency, and urgency.  She has not taken any additional OTC medication for this. No known exposure to STDs since last visit.   Rash Pt has history of psoriasis, she reports a rash that has flared over her left flank and right underarm.  Has only had flare 1-2 times in her life.  Took triamcinolone which cleared it up soon thereafter.  She does not have any more of this cream at home and requests refill.    ROS: No fever, chills, nausea, vomiting.  No urinary symptoms.    Social: pt is a never smoker.  Medications reviewed. Objective:   BP 102/70   Pulse 86   Temp 98.7 F (37.1 C) (Oral)   Wt 134 lb (60.8 kg)   LMP 09/29/2017 (Approximate)   SpO2 97%   BMI 21.63 kg/m  Vitals and nursing note reviewed.  General: 27 yo female, NAD  CV: RRR no MRG  Lungs: CTAB, normal effort  Abdomen: soft, +bs  Pelvic exam: VULVA: normal appearing vulva with no masses, or tenderness, erythema present at external vaginal orifice, VAGINA: normal appearing vagina with normal color, moderate thick white discharge present in vaginal vault.  Skin: warm, dry Extremities: warm and well perfused, normal tone Neuro: alert, oriented x3, no focal deficits   Assessment & Plan:   Vaginal candidiasis Workup from 7/10 negative and no known exposure to STDs since last visit.  Will not need further testing today. Exam notable for skin irritation and erythema externally likely 2/2 yeast vaginitis with small amount  of thick white discharge present within vaginal vault.  -Rx: diflucan, 2 tabs -return if symptoms worsen or do not improve  -counseled on safe sex   Pityriasis Appears to have returned over left flank and right arm.  No red flags on exam. Instructed to bathe or shower with warm water, recommend moisturizing.  Refill triamcinolone.   Meds ordered this encounter  Medications  . fluconazole (DIFLUCAN) 150 MG tablet    Sig: Take 1 tablet (150 mg total) by mouth daily.    Dispense:  2 tablet    Refill:  0  . triamcinolone cream (KENALOG) 0.1 %    Sig: Apply 1 application topically 2 (two) times daily.    Dispense:  30 g    Refill:  0    Freddrick MarchYashika Trinitey Roache, MD [facility], PGY-3 10/29/2017 9:22 AM

## 2017-10-29 NOTE — Patient Instructions (Addendum)
It was nice meeting you today.  You were seen in clinic for ongoing vaginal itching and irritation.  Your symptoms are likely due to a yeast infection.  I am sending in 2 additional tablets for you.  You can take 1 tablet today and the other tablet 2 days later.  If you do not have any improvement in your symptoms or they worsen, please make an appointment to be seen by a provider.  Additionally, I have sent in triamcinolone for your pityriasis.  Please call clinic if you have any questions.  Be well, Freddrick MarchYashika Jazzelle Zhang MD

## 2017-10-29 NOTE — Assessment & Plan Note (Addendum)
Appears to have returned over left flank and right arm.  No red flags on exam. Instructed to bathe or shower with warm water, recommend moisturizing.  Refill triamcinolone.

## 2017-11-06 ENCOUNTER — Ambulatory Visit (INDEPENDENT_AMBULATORY_CARE_PROVIDER_SITE_OTHER): Payer: BLUE CROSS/BLUE SHIELD

## 2017-11-06 DIAGNOSIS — Z111 Encounter for screening for respiratory tuberculosis: Secondary | ICD-10-CM

## 2017-11-06 NOTE — Progress Notes (Signed)
   Tuberculin skin test applied to left ventral forearm. Appointment made for 11/08/2017 for PPD reading.  Ples SpecterAlisa Brake, RN [facility] RN)

## 2017-11-08 ENCOUNTER — Ambulatory Visit (INDEPENDENT_AMBULATORY_CARE_PROVIDER_SITE_OTHER): Payer: BLUE CROSS/BLUE SHIELD

## 2017-11-08 DIAGNOSIS — Z111 Encounter for screening for respiratory tuberculosis: Secondary | ICD-10-CM

## 2017-11-08 LAB — TB SKIN TEST
Induration: 0 mm
TB Skin Test: NEGATIVE

## 2017-11-08 NOTE — Progress Notes (Signed)
   Patient here today to have PPD site read.   PPD read and results entered in Epic. Result: 0 mm induration. Interpretation: Negative Letter given for employer.  Alisa Brake, RN ([facility] RN) 

## 2017-11-15 ENCOUNTER — Encounter: Payer: Self-pay | Admitting: Family Medicine

## 2017-11-15 ENCOUNTER — Ambulatory Visit (INDEPENDENT_AMBULATORY_CARE_PROVIDER_SITE_OTHER): Payer: BLUE CROSS/BLUE SHIELD | Admitting: Family Medicine

## 2017-11-15 ENCOUNTER — Other Ambulatory Visit: Payer: Self-pay

## 2017-11-15 DIAGNOSIS — B009 Herpesviral infection, unspecified: Secondary | ICD-10-CM | POA: Diagnosis not present

## 2017-11-15 DIAGNOSIS — F39 Unspecified mood [affective] disorder: Secondary | ICD-10-CM

## 2017-11-15 DIAGNOSIS — Z3009 Encounter for other general counseling and advice on contraception: Secondary | ICD-10-CM

## 2017-11-15 MED ORDER — VALACYCLOVIR HCL 1 G PO TABS: 1000.0000 mg | ORAL_TABLET | Freq: Every day | ORAL | 3 refills | Status: DC

## 2017-11-15 NOTE — Progress Notes (Signed)
Date of Visit: 11/15/2017   HPI:  Patient presents to discuss recurrent genital herpes outbreaks.  Genital herpes - currently taking valtrex 500mg  twice daily only during outbreaks. Patient reporting monthly outbreaks and would like to take daily suppressive therapy. Has a friend who does this with good results.   Mood disorder - no longer taking lexapro. Mood is well controlled off medications. Denies SI/HI. Wants to continue without antidepressants for now.  Contraception - recently had IUD removed. Not sexually active at present, and no plans to be sexually active. Has 25 year old daughter. Does not want any birth control right now as she may want to have a child in the next 1-2 years and prefers to be hormone free leading up to that.  ROS: See HPI.  PMFSH: history of mood disorder, folliculitis, HSV2, marijuana use, alopecia areata, history of high risk HPV, paroxysmal SVT  PHYSICAL EXAM: BP 100/62   Pulse 87   Temp 98.7 F (37.1 C) (Oral)   Ht 5\' 6"  (1.676 m)   Wt 136 lb 3.2 oz (61.8 kg)   SpO2 95%   BMI 21.98 kg/m  Gen: no acute distress, pleasant, cooperative Neuro: alert, speech normal, grossly nonfocal Psych: normal range of affect, well groomed, speech normal in rate and volume, normal eye contact   ASSESSMENT/PLAN:  Health maintenance:  -UTD on HM items including pap  Contraception management Stressed importance of condoms if she does have sex, since she does not desire pregnancy right now and is not interested in other forms of birth control.  Herpes simplex type 2 infection Recurrent monthly outbreaks. Will change valtrex to 1g daily for daily suppressive therapy.  Mood disorder (HCC) Mood doing well despite being off lexapro. Continue to monitor.  FOLLOW UP: Follow up in 1 year for routine physical, sooner if needed.  GrenadaBrittany J. Pollie MeyerMcIntyre, MD [facility]

## 2017-11-15 NOTE — Patient Instructions (Addendum)
Sent in valtrex for you to take every day. Will help suppress the herpes from flaring.  Follow up in 1 year for physical, sooner if needed.  Be well, Dr. Pollie Meyer    Genital Herpes Genital herpes is a common sexually transmitted infection (STI) that is caused by a virus. The virus spreads from person to person through sexual contact. Infection can cause itching, blisters, and sores around the genitals or rectum. Symptoms may last several days and then go away This is called an outbreak. However, the virus remains in your body, so you may have more outbreaks in the future. The time between outbreaks varies and can be months or years. Genital herpes affects men and women. It is particularly concerning for pregnant women because the virus can be passed to the baby during delivery and can cause serious problems. Genital herpes is also a concern for people who have a weak disease-fighting (immune) system. What are the causes? This condition is caused by the herpes simplex virus (HSV) type 1 or type 2. The virus may spread through:  Sexual contact with an infected person, including vaginal, anal, and oral sex.  Contact with fluid from a herpes sore.  The skin. This means that you can get herpes from an infected partner even if he or she does not have a visible sore or does not know that he or she is infected.  What increases the risk? You are more likely to develop this condition if:  You have sex with many partners.  You do not use latex condoms during sex.  What are the signs or symptoms? Most people do not have symptoms (asymptomatic) or have mild symptoms that may be mistaken for other skin problems. Symptoms may include:  Small red bumps near the genitals, rectum, or mouth. These bumps turn into blisters and then turn into sores.  Flu-like symptoms, including: ? Fever. ? Body aches. ? Swollen lymph nodes. ? Headache.  Painful urination.  Pain and itching in the genital area or  rectal area.  Vaginal discharge.  Tingling or shooting pain in the legs and buttocks.  Generally, symptoms are more severe and last longer during the first (primary) outbreak. Flu-like symptoms are also more common during the primary outbreak. How is this diagnosed? Genital herpes may be diagnosed based on:  A physical exam.  Your medical history.  Blood tests.  A test of a fluid sample (culture) from an open sore.  How is this treated? There is no cure for this condition, but treatment with antiviral medicines that are taken by mouth (orally) can do the following:  Speed up healing and relieve symptoms.  Help to reduce the spread of the virus to sexual partners.  Limit the chance of future outbreaks, or make future outbreaks shorter.  Lessen symptoms of future outbreaks.  Your health care provider may also recommend pain relief medicines, such as aspirin or ibuprofen. Follow these instructions at home: Sexual activity  Do not have sexual contact during active outbreaks.  Practice safe sex. Latex condoms and female condoms may help prevent the spread of the herpes virus. General instructions  Keep the affected areas dry and clean.  Take over-the-counter and prescription medicines only as told by your health care provider.  Avoid rubbing or touching blisters and sores. If you do touch blisters or sores: ? Wash your hands thoroughly with soap and water. ? Do not touch your eyes afterward.  To help relieve pain or itching, you may take the following  actions as directed by your health care provider: ? Apply a cold, wet cloth (cold compress) to affected areas 4-6 times a day. ? Apply a substance that protects your skin and reduces bleeding (astringent). ? Apply a gel that helps relieve pain around sores (lidocaine gel). ? Take a warm, shallow bath that cleans the genital area (sitz bath).  Keep all follow-up visits as told by your health care provider. This is  important. How is this prevented?  Use condoms. Although anyone can get genital herpes during sexual contact, even with the use of a condom, a condom can provide some protection.  Avoid having multiple sexual partners.  Talk with your sexual partner about any symptoms either of you may have. Also, talk with your partner about any history of STIs.  Get tested for STIs before you have sex. Ask your partner to do the same.  Do not have sexual contact if you have symptoms of genital herpes. Contact a health care provider if:  Your symptoms are not improving with medicine.  Your symptoms return.  You have new symptoms.  You have a fever.  You have abdominal pain.  You have redness, swelling, or pain in your eye.  You notice new sores on other parts of your body.  You are a woman and experience bleeding between menstrual periods.  You have had herpes and you become pregnant or plan to become pregnant. Summary  Genital herpes is a common sexually transmitted infection (STI) that is caused by the herpes simplex virus (HSV) type 1 or type 2.  These viruses are most often spread through sexual contact with an infected person.  You are more likely to develop this condition if you have sex with many partners or you have unprotected sex.  Most people do not have symptoms (asymptomatic) or have mild symptoms that may be mistaken for other skin problems. Symptoms occur as outbreaks that may happen months or years apart.  There is no cure for this condition, but treatment with oral antiviral medicines can reduce symptoms, reduce the chance of spreading the virus to a partner, prevent future outbreaks, or shorten future outbreaks. This information is not intended to replace advice given to you by your health care provider. Make sure you discuss any questions you have with your health care provider. Document Released: 03/23/2000 Document Revised: 02/24/2016 Document Reviewed:  02/24/2016 Elsevier Interactive Patient Education  [employer]2018 [employer] Inc.

## 2017-11-15 NOTE — Assessment & Plan Note (Signed)
Stressed importance of condoms if she does have sex, since she does not desire pregnancy right now and is not interested in other forms of birth control.

## 2017-11-15 NOTE — Assessment & Plan Note (Signed)
Mood doing well despite being off lexapro. Continue to monitor.

## 2017-11-15 NOTE — Assessment & Plan Note (Signed)
Recurrent monthly outbreaks. Will change valtrex to 1g daily for daily suppressive therapy.

## 2018-01-29 ENCOUNTER — Ambulatory Visit: Payer: BLUE CROSS/BLUE SHIELD

## 2018-01-31 ENCOUNTER — Ambulatory Visit: Payer: BLUE CROSS/BLUE SHIELD | Admitting: Family Medicine

## 2018-01-31 ENCOUNTER — Other Ambulatory Visit (HOSPITAL_COMMUNITY)
Admission: RE | Admit: 2018-01-31 | Discharge: 2018-01-31 | Disposition: A | Discharge status: Home / Self Care | Payer: BLUE CROSS/BLUE SHIELD | Source: Ambulatory Visit | Attending: Family Medicine | Admitting: Family Medicine

## 2018-01-31 ENCOUNTER — Encounter: Payer: Self-pay | Admitting: Family Medicine

## 2018-01-31 VITALS — BP 100/68 | HR 92 | Temp 98.6°F | Wt 145.0 lb

## 2018-01-31 DIAGNOSIS — N898 Other specified noninflammatory disorders of vagina: Secondary | ICD-10-CM | POA: Diagnosis not present

## 2018-01-31 DIAGNOSIS — B9689 Other specified bacterial agents as the cause of diseases classified elsewhere: Secondary | ICD-10-CM | POA: Diagnosis not present

## 2018-01-31 DIAGNOSIS — N760 Acute vaginitis: Secondary | ICD-10-CM

## 2018-01-31 LAB — POCT WET PREP (WET MOUNT)
CLUE CELLS WET PREP WHIFF POC: NEGATIVE
Trichomonas Wet Prep HPF POC: ABSENT

## 2018-01-31 MED ORDER — METRONIDAZOLE 500 MG PO TABS: 500.0000 mg | ORAL_TABLET | Freq: Two times a day (BID) | ORAL | 0 refills | Status: DC

## 2018-01-31 NOTE — Patient Instructions (Signed)

## 2018-01-31 NOTE — Assessment & Plan Note (Signed)
As evidenced on wet prep.  GC chlamydia pending.  Will treat with 7 days of metronidazole.  Patient to follow-up PRN.

## 2018-01-31 NOTE — Progress Notes (Signed)
    Subjective:  Natasha Garcia is a 27 y.o. female who presents to the [facility] today with a chief complaint of vaginal discharge.   HPI:  Patient has had 1 week of vaginal discharge.  Is been thin.  Endorses itching.  Says that this is similar to a yeast infection, however the discharge is not been thick white frothy.  It has had a fishy smell.  Patient has not had any recent antibiotics.  Patient has a history of HSV, however she denies any lesions or pain.  Patient is sexually active with her partner.  Intermittent use of barrier protection.    ROS: Per HPI   Objective:  Physical Exam: BP 100/68   Pulse 92   Temp 98.6 F (37 C)   Wt 145 lb (65.8 kg)   LMP  (LMP Unknown)   SpO2 98%   BMI 23.40 kg/m   Gen: NAD, resting comfortably CV: RRR with no murmurs appreciated Pulm: NWOB, CTAB with no crackles, wheezes, or rhonchi GI: Normal bowel sounds present. Soft, Nontender, Nondistended. MSK: no edema, cyanosis, or clubbing noted Skin: warm, dry Neuro: grossly normal, moves all extremities GU: Retroverted cervix, thin mucoid discharge, no lesions  Results for orders placed or performed in visit on 01/31/18 (from the past 72 hour(s))  POCT Wet Prep [facility] [place])     Status: Abnormal   Collection Time: 01/31/18  4:35 PM  Result Value Ref Range   Source Wet Prep POC VAG    WBC, Wet Prep HPF POC 1-5    Bacteria Wet Prep HPF POC Moderate (A) Few   Clue Cells Wet Prep HPF POC None None   Clue Cells Wet Prep Whiff POC Negative Whiff    Yeast Wet Prep HPF POC None None   Trichomonas Wet Prep HPF POC Absent Absent     Assessment/Plan:  Bacterial vaginitis As evidenced on wet prep.  GC chlamydia pending.  Will treat with 7 days of metronidazole.  Patient to follow-up PRN.    Lab Orders     POCT Wet Prep [facility])  Meds ordered this encounter  Medications  . metroNIDAZOLE (FLAGYL) 500 MG tablet    Sig: Take 1 tablet (500 mg total) by mouth 2 (two) times daily.    Dispense:   21 tablet    Refill:  0      Thomes Dinning, MD, MS FAMILY MEDICINE RESIDENT - PGY2 01/31/2018 6:23 PM

## 2018-02-04 LAB — CERVICOVAGINAL ANCILLARY ONLY
Chlamydia: NEGATIVE
NEISSERIA GONORRHEA: NEGATIVE

## 2018-03-27 ENCOUNTER — Other Ambulatory Visit (HOSPITAL_COMMUNITY)
Admission: RE | Admit: 2018-03-27 | Discharge: 2018-03-27 | Disposition: A | Discharge status: Home / Self Care | Payer: BLUE CROSS/BLUE SHIELD | Source: Ambulatory Visit | Attending: Family Medicine | Admitting: Family Medicine

## 2018-03-27 ENCOUNTER — Ambulatory Visit: Payer: BLUE CROSS/BLUE SHIELD | Admitting: Family Medicine

## 2018-03-27 ENCOUNTER — Other Ambulatory Visit: Payer: Self-pay

## 2018-03-27 VITALS — BP 110/64 | HR 84 | Temp 98.3°F | Wt 143.0 lb

## 2018-03-27 DIAGNOSIS — Z113 Encounter for screening for infections with a predominantly sexual mode of transmission: Secondary | ICD-10-CM | POA: Diagnosis present

## 2018-03-27 DIAGNOSIS — Z3202 Encounter for pregnancy test, result negative: Secondary | ICD-10-CM | POA: Diagnosis not present

## 2018-03-27 DIAGNOSIS — N939 Abnormal uterine and vaginal bleeding, unspecified: Secondary | ICD-10-CM | POA: Diagnosis not present

## 2018-03-27 LAB — POCT WET PREP (WET MOUNT)
Clue Cells Wet Prep Whiff POC: NEGATIVE
TRICHOMONAS WET PREP HPF POC: ABSENT

## 2018-03-27 LAB — POCT URINE PREGNANCY: Preg Test, Ur: NEGATIVE

## 2018-03-27 NOTE — Progress Notes (Signed)
  Subjective:    Patient ID: Natasha Garcia, female    DOB: 07/20/1990, 27 y.o.   MRN: [id number]   CC: STD check  HPI:  STI testing: Patient here for STI testing after possible exposure. patient using condoms for contraception and one broke. Up to date on her Pap. Has h/o trich STI ROS: Denies any abnormal vaginal bleeding, vaginal discharge, abdominal pain, fevers, chills, vomiting or diarrhea.  Does report that her last menstrual period was in April right before receiving the Depo shot.  Patient has not had a period since then and is quite worried.   Smoking status reviewed  ROS: 10 point ROS is otherwise negative, except as mentioned in HPI  Patient Active Problem List   Diagnosis Date Noted  . Abnormal uterine bleeding 03/28/2018  . Yeast infection 07/22/2017  . Mood disorder (HCC) 01/11/2017  . Contraception management 11/19/2016  . Folliculitis 11/19/2016  . Rash and nonspecific skin eruption 08/28/2016  . Vaginal candidiasis 03/14/2016  . Trichomonal vaginitis 02/03/2016  . Marijuana dependence (HCC) 01/27/2016  . Alopecia areata 01/06/2016  . 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 Episodic mood disorder (HCC) 05/10/2014  . Cervical high risk HPV (Garcia papillomavirus) test positive 04/21/2014  . Possible exposure to STD 04/19/2014  . Screening for malignant neoplasm of cervix 04/19/2014  . Paroxysmal supraventricular tachycardia (HCC) 01/18/2014  . Pityriasis 01/01/2013  . Bacterial vaginitis 09/13/2010  . SCOLIOSIS, MILD 06/06/2007     Objective:  BP 110/64   Pulse 84   Temp 98.3 F (36.8 C) (Oral)   Wt 143 lb (64.9 kg)   LMP 10/25/2017   BMI 23.08 kg/m  Vitals and nursing note reviewed  General: NAD, pleasant Respiratory: normal effort Abdomen: soft, nontender, nondistended GU/GYN: External genitalia within normal limits.  Vaginal mucosa pink, moist, normal rugae.  Nonfriable cervix without lesions, no discharge or bleeding noted on speculum  exam.  Bimanual exam revealed normal, nongravid uterus.  No cervical motion tenderness. No adnexal masses bilaterally. Exam performed in the presence of a chaperone. Extremities: no edema or cyanosis. WWP. Skin: warm and dry, no rashes noted Neuro: alert and oriented, no focal deficits Psych: normal affect  Assessment & Plan:   STD check HIV and RPR negative.  Wet prep negative.  GC/chlamydia pending  Abnormal uterine bleeding Patient concerned that she has not had a period since she had the Depakote shot in May 2019.  This is been 7 months since her shot.  Patient reports that she is only using condoms for contraception and came in for an STD check today because her condom broke.   -Urine pregnancy negative in office.   -Patient to follow-up in 2 months if she continues to not have a period, as this could be normal for 9 months following a Depo shot.    [place] Afsana Liera, DO Family Medicine Resident PGY-2

## 2018-03-27 NOTE — Patient Instructions (Signed)
Thank you for coming to see me today. It was a pleasure! Today we talked about:   I will let you know your lab results when it comes back.   Please follow-up with your regular doctor in 2 months or sooner as needed.  If you have any questions or concerns, please do not hesitate to call the office at [phone].  Take Care,   [place] Lucio Litsey, DO

## 2018-03-28 DIAGNOSIS — N939 Abnormal uterine and vaginal bleeding, unspecified: Secondary | ICD-10-CM | POA: Insufficient documentation

## 2018-03-28 LAB — HIV ANTIBODY (ROUTINE TESTING W REFLEX): HIV Screen 4th Generation wRfx: NONREACTIVE

## 2018-03-28 LAB — CERVICOVAGINAL ANCILLARY ONLY
CHLAMYDIA, DNA PROBE: NEGATIVE
Neisseria Gonorrhea: NEGATIVE

## 2018-03-28 LAB — RPR: RPR Ser Ql: NONREACTIVE

## 2018-03-28 NOTE — Assessment & Plan Note (Addendum)
Patient concerned that she has not had a period since she had the Depakote shot in May 2019.  This is been 7 months since her shot.  Patient reports that she is only using condoms for contraception and came in for an STD check today because her condom broke.   -Urine pregnancy negative in office.   -Patient to follow-up in 2 months if she continues to not have a period, as this could be normal for 9 months following a Depo shot.

## 2018-04-11 ENCOUNTER — Encounter: Payer: Self-pay | Admitting: Student in an Organized Health Care Education/Training Program

## 2018-04-11 ENCOUNTER — Other Ambulatory Visit: Payer: Self-pay

## 2018-04-11 ENCOUNTER — Ambulatory Visit: Payer: BLUE CROSS/BLUE SHIELD | Admitting: Student in an Organized Health Care Education/Training Program

## 2018-04-11 VITALS — BP 110/60 | HR 95 | Temp 98.0°F | Ht 66.0 in | Wt 142.0 lb

## 2018-04-11 DIAGNOSIS — B373 Candidiasis of vulva and vagina: Secondary | ICD-10-CM | POA: Diagnosis not present

## 2018-04-11 DIAGNOSIS — N760 Acute vaginitis: Secondary | ICD-10-CM

## 2018-04-11 DIAGNOSIS — B9689 Other specified bacterial agents as the cause of diseases classified elsewhere: Secondary | ICD-10-CM

## 2018-04-11 DIAGNOSIS — N898 Other specified noninflammatory disorders of vagina: Secondary | ICD-10-CM | POA: Diagnosis not present

## 2018-04-11 DIAGNOSIS — B3731 Acute candidiasis of vulva and vagina: Secondary | ICD-10-CM

## 2018-04-11 MED ORDER — FLUCONAZOLE 150 MG PO TABS: 150.0000 mg | ORAL_TABLET | Freq: Once | ORAL | 1 refills | Status: AC

## 2018-04-11 MED ORDER — METRONIDAZOLE 500 MG PO TABS: 500.0000 mg | ORAL_TABLET | Freq: Three times a day (TID) | ORAL | 0 refills | Status: DC

## 2018-04-11 NOTE — Patient Instructions (Signed)
It was a pleasure to see you today!  To summarize our discussion for this visit:  Positive for BV and yeast . Diflucan and flagyl to pharmacy  Call the clinic at [phone] if your symptoms worsen or you have any concerns.  Thank you for allowing me to take part in your care,  Dr. Jamelle Rushing   Thanks for choosing [facility] for your primary care.

## 2018-04-12 DIAGNOSIS — N760 Acute vaginitis: Secondary | ICD-10-CM

## 2018-04-12 DIAGNOSIS — B9689 Other specified bacterial agents as the cause of diseases classified elsewhere: Secondary | ICD-10-CM | POA: Insufficient documentation

## 2018-04-12 NOTE — Progress Notes (Signed)
   Subjective:    Patient ID: Natasha Garcia, female    DOB: January 10, 1991, 28 y.o.   MRN: [id number]   CC: vaginal discharge  HPI: patient complains of vaginal itching and discharge for about 1 week. She denies any recent antibiotic use. She states that she has a thin white discharge that drips when she goes to the bathroom and is increased from her baseline. Pruritus is excessive around introitus and on labia in particular but she denies scratching. Denies redness or swelling. Denies frequency, foul smell, change in color/consistency or dysuria. Denies any new sexual partners. She and her partner had negative STI screening just prior to onset of symptoms. She has not had her period for several months and is being followed up with her PCP. She declines offer for pregnancy test stating there is no way she could be pregnant but also states that she uses no form of birth control and is sexually active with her boyfriend. She has had yeast infections in the past and states that this is similar to those episodes.    Smoking status reviewed   ROS: pertinent noted in the HPI   Past Medical History:  Diagnosis Date  . Anxiety   . Hx of herpes genitalis   . No pertinent past medical history     Past Surgical History:  Procedure Laterality Date  . NO PAST SURGERIES      Past medical history, surgical, family, and social history reviewed and updated in the EMR as appropriate.  Objective:  BP 110/60   Pulse 95   Temp 98 F (36.7 C) (Oral)   Ht 5\' 6"  (1.676 m)   Wt 142 lb (64.4 kg)   SpO2 97%   BMI 22.92 kg/m   Vitals and nursing note reviewed  General: NAD, pleasant, able to participate in exam Cardiac: RRR, S1 S2 present. normal heart sounds, no murmurs. Respiratory: CTAB, normal effort, No wheezes, rales or rhonchi Extremities: no edema or cyanosis. Skin: warm and dry, no rashes noted Neuro: alert, no obvious focal deficits Psych: Normal affect and mood GU: no erythema or  excoriations to vaginal tissue. No overt odor. White mucoid discharge in vaginal canal.  On wet prep slide- several hyphae and clue cells observed. Negative whiff test.  Assessment & Plan:    Vaginal candidiasis Wet prep positive for yeast Prescribed diflucan  F/u if symptoms not resolved after treatment  Bacterial vaginosis Wet prep negative whiff test Positive for multiple clue cells Prescribed flagyl Return if symptoms not resolved with treatment Counseled on not drinking alcohol with treatment Counseled on conception and patient declined pregnancy test    Jamelle Rushing, DO [facility] PGY-1

## 2018-04-12 NOTE — Assessment & Plan Note (Signed)
Wet prep positive for yeast Prescribed diflucan  F/u if symptoms not resolved after treatment

## 2018-04-12 NOTE — Assessment & Plan Note (Signed)
Wet prep negative whiff test Positive for multiple clue cells Prescribed flagyl Return if symptoms not resolved with treatment Counseled on not drinking alcohol with treatment Counseled on conception and patient declined pregnancy test

## 2018-07-22 ENCOUNTER — Other Ambulatory Visit: Payer: Self-pay

## 2018-07-22 ENCOUNTER — Telehealth (INDEPENDENT_AMBULATORY_CARE_PROVIDER_SITE_OTHER): Payer: BLUE CROSS/BLUE SHIELD | Admitting: Family Medicine

## 2018-07-22 DIAGNOSIS — R21 Rash and other nonspecific skin eruption: Secondary | ICD-10-CM | POA: Diagnosis not present

## 2018-07-22 MED ORDER — TRIAMCINOLONE ACETONIDE 0.1 % EX CREA: 1.0000 "application " | TOPICAL_CREAM | Freq: Two times a day (BID) | CUTANEOUS | 0 refills | Status: DC

## 2018-07-22 NOTE — Assessment & Plan Note (Signed)
Reports history of psoriasis.  Unable to examine patient over the phone, but previous note where patient was prescribed Kenalog cream seems to be consistent with psoriasis.  Patient educated on avoiding over drying the area and using unscented moisturizer. -Kenalog cream

## 2018-07-22 NOTE — Progress Notes (Signed)
[facility] Telemedicine Visit  Patient consented to have virtual visit. Method of visit: Video was attempted, but technology challenges prevented patient from using video, so visit was conducted via telephone.  Encounter participants: Patient: Natasha Garcia - located at work Provider: Unknown Jim - located at [facility] Others (if applicable): None  Chief Complaint: Rash  HPI: Patient reports a history of psoriasis.  She states that she has been without her Kenalog cream because that is in [place] and she has been working here in [place] without the ability to return.  She states that she is having a new outbreak on her abdomen, which is her usual location.  She states that she has used Kenalog cream in the past with good improvement.  She denies any other complaints.  ROS: per HPI  Pertinent PMHx: Reports history of psoriasis  Exam:  Respiratory: Speaking in complete sentences and does not appear to be in respiratory distress over the phone Attempted to use video chat to examine rash, but was unsuccessful due to technology challenges  Assessment/Plan:  Rash and nonspecific skin eruption Reports history of psoriasis.  Unable to examine patient over the phone, but previous note where patient was prescribed Kenalog cream seems to be consistent with psoriasis.  Patient educated on avoiding over drying the area and using unscented moisturizer. -Kenalog cream    Time spent during visit with patient: 12 minutes

## 2018-09-29 ENCOUNTER — Encounter: Payer: BLUE CROSS/BLUE SHIELD | Admitting: Family Medicine

## 2018-10-22 ENCOUNTER — Ambulatory Visit: Payer: BC Managed Care – PPO

## 2018-11-05 ENCOUNTER — Other Ambulatory Visit: Payer: Self-pay

## 2018-11-05 ENCOUNTER — Ambulatory Visit (INDEPENDENT_AMBULATORY_CARE_PROVIDER_SITE_OTHER): Payer: BC Managed Care – PPO | Admitting: *Deleted

## 2018-11-05 ENCOUNTER — Ambulatory Visit: Payer: BC Managed Care – PPO

## 2018-11-05 DIAGNOSIS — Z111 Encounter for screening for respiratory tuberculosis: Secondary | ICD-10-CM | POA: Diagnosis not present

## 2018-11-06 NOTE — Progress Notes (Signed)
Patient is here for a PPD placement.  PPD placed in Left forearm.  Patient will return 11/07/18 to have PPD read. Christen Bame, CMA

## 2018-11-07 ENCOUNTER — Other Ambulatory Visit: Payer: Self-pay

## 2018-11-07 ENCOUNTER — Ambulatory Visit (INDEPENDENT_AMBULATORY_CARE_PROVIDER_SITE_OTHER): Payer: Medicaid Other | Admitting: *Deleted

## 2018-11-07 DIAGNOSIS — Z111 Encounter for screening for respiratory tuberculosis: Secondary | ICD-10-CM

## 2018-11-07 LAB — TB SKIN TEST
Induration: 0 mm
TB Skin Test: NEGATIVE

## 2018-11-07 NOTE — Progress Notes (Signed)
Patient is here for a PPD read.  It was placed on 11/05/18 in the left forearm @ 3:40 pm.    PPD RESULTS:  Result: negative Induration: 0 mm  Letter created and given to patient for documentation purposes. Christen Bame, CMA

## 2018-11-14 ENCOUNTER — Ambulatory Visit: Payer: Medicaid Other | Admitting: Family Medicine

## 2018-11-18 ENCOUNTER — Ambulatory Visit: Payer: Medicaid Other

## 2018-11-24 ENCOUNTER — Other Ambulatory Visit: Payer: Self-pay

## 2018-11-24 DIAGNOSIS — Z20822 Contact with and (suspected) exposure to covid-19: Secondary | ICD-10-CM

## 2018-11-26 LAB — NOVEL CORONAVIRUS, NAA: SARS-CoV-2, NAA: NOT DETECTED
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
## 2018-12-05 ENCOUNTER — Ambulatory Visit: Payer: Medicaid Other | Admitting: Family Medicine

## 2019-01-20 ENCOUNTER — Ambulatory Visit (INDEPENDENT_AMBULATORY_CARE_PROVIDER_SITE_OTHER): Payer: BC Managed Care – PPO | Admitting: Family Medicine

## 2019-01-20 ENCOUNTER — Other Ambulatory Visit: Payer: Self-pay

## 2019-01-20 ENCOUNTER — Ambulatory Visit: Payer: BC Managed Care – PPO | Admitting: Family Medicine

## 2019-01-20 VITALS — BP 124/78 | HR 85

## 2019-01-20 DIAGNOSIS — Z9189 Other specified personal risk factors, not elsewhere classified: Secondary | ICD-10-CM | POA: Diagnosis not present

## 2019-01-20 DIAGNOSIS — Z7289 Other problems related to lifestyle: Secondary | ICD-10-CM

## 2019-01-20 DIAGNOSIS — F329 Major depressive disorder, single episode, unspecified: Secondary | ICD-10-CM | POA: Insufficient documentation

## 2019-01-20 DIAGNOSIS — R45851 Suicidal ideations: Secondary | ICD-10-CM

## 2019-01-20 DIAGNOSIS — F1220 Cannabis dependence, uncomplicated: Secondary | ICD-10-CM

## 2019-01-20 DIAGNOSIS — F419 Anxiety disorder, unspecified: Secondary | ICD-10-CM

## 2019-01-20 DIAGNOSIS — F32A Depression, unspecified: Secondary | ICD-10-CM

## 2019-01-20 DIAGNOSIS — F1090 Alcohol use, unspecified, uncomplicated: Secondary | ICD-10-CM | POA: Insufficient documentation

## 2019-01-20 DIAGNOSIS — Z789 Other specified health status: Secondary | ICD-10-CM | POA: Insufficient documentation

## 2019-01-20 HISTORY — DX: Anxiety disorder, unspecified: F41.9

## 2019-01-20 HISTORY — DX: Depression, unspecified: F32.A

## 2019-01-20 MED ORDER — BUPROPION HCL ER (XL) 150 MG PO TB24: 150.0000 mg | ORAL_TABLET | Freq: Every day | ORAL | 0 refills | Status: DC

## 2019-01-20 NOTE — Patient Instructions (Signed)
Suicidal Feelings: How to Help Yourself Suicide is when you end your own life. There are many things you can do to help yourself feel better when struggling with these feelings. Many services and people are available to support you and others who struggle with similar feelings.  If you ever feel like you may hurt yourself or others, or have thoughts about taking your own life, get help right away. To get help:  Call your local emergency services (911 in the [place]  The [employer]'s health and human services helpline (211 in the [place]  Go to your nearest emergency department.  Call a suicide hotline to speak with a trained counselor. The following suicide hotlines are available in the [place]: ? 1-800-273-TALK (1-800-273-8255). ? 1-800-SUICIDE (1-800-784-2433). ? 1-888-628-9454. This is a hotline for Spanish speakers. ? 1-800-799-4889. This is a hotline for TTY users. ? 1-866-4-U-TREVOR (1-866-488-7386). This is a hotline for lesbian, gay, bisexual, transgender, or questioning youth. ? For a list of hotlines in [place], visit www.suicide.org/hotlines/international/[place]-suicide-hotlines.html  Contact a crisis center or a local suicide prevention center. To find a crisis center or suicide prevention center: ? Call your local hospital, clinic, community service organization, mental health center, social service provider, or health department. Ask for help with connecting to a crisis center. ? For a list of crisis centers in the [place], visit: suicidepreventionlifeline.org ? For a list of crisis centers in [place], visit: suicideprevention.ca How to help yourself feel better   Promise yourself that you will not do anything extreme when you have suicidal feelings. Remember, there is hope. Many people have gotten through suicidal thoughts and feelings, and you can too. If you have had these feelings before, remind yourself that you can get through them again.  Let family, friends,  teachers, or counselors know how you are feeling. Try not to separate yourself from those who care about you and want to help you. Talk with someone every day, even if you do not feel sociable. Face-to-face conversation is best to help them understand your feelings.  Contact a mental health care provider and work with this person regularly.  Make a safety plan that you can follow during a crisis. Include phone numbers of suicide prevention hotlines, mental health professionals, and trusted friends and family members you can call during an emergency. Save these numbers on your phone.  If you are thinking of taking a lot of medicine, give your medicine to someone who can give it to you as prescribed. If you are on antidepressants and are concerned you will overdose, tell your health care provider so that he or she can give you safer medicines.  Try to stick to your routines. Follow a schedule every day. Make self-care a priority.  Make a list of realistic goals, and cross them off when you achieve them. Accomplishments can give you a sense of worth.  Wait until you are feeling better before doing things that you find difficult or unpleasant.  Do things that you have always enjoyed to take your mind off your feelings. Try reading a book, or listening to or playing music. Spending time outside, in nature, may help you feel better. Follow these instructions at home:   Visit your primary health care provider every year for a checkup.  Work with a mental health care provider as needed.  Eat a well-balanced diet, and eat regular meals.  Get plenty of rest.  Exercise if you are able. Just 30 minutes of exercise each day can   help you feel better.  Take over-the-counter and prescription medicines only as told by your health care provider. Ask your mental health care provider about the possible side effects of any medicines you are taking.  Do not use alcohol or drugs, and remove these substances  from your home.  Remove weapons, poisons, knives, and other deadly items from your home. General recommendations  Keep your living space well lit.  When you are feeling well, write yourself a letter with tips and support that you can read when you are not feeling well.  Remember that life's difficulties can be sorted out with help. Conditions can be treated, and you can learn behaviors and ways of thinking that will help you. Where to find more information  National Suicide Prevention Lifeline: www.suicidepreventionlifeline.org  Hopeline: www.hopeline.com  [employer] for Suicide Prevention: https://www.ayers.com/  [employer] (for lesbian, gay, bisexual, transgender, or questioning youth): www.thetrevorproject.org Contact a health care provider if:  You feel as though you are a burden to others.  You feel agitated, angry, vengeful, or have extreme mood swings.  You have withdrawn from family and friends. Get help right away if:  You are talking about suicide or wishing to die.  You start making plans for how to commit suicide.  You feel that you have no reason to live.  You start making plans for putting your affairs in order, saying goodbye, or giving your possessions away.  You feel guilt, shame, or unbearable pain, and it seems like there is no way out.  You are frequently using drugs or alcohol.  You are engaging in risky behaviors that could lead to death. If you have any of these symptoms, get help right away. Call emergency services, go to your nearest emergency department or crisis center, or call a suicide crisis helpline. Summary  Suicide is when you take your own life.  Promise yourself that you will not do anything extreme when you have suicidal feelings.  Let family, friends, teachers, or counselors know how you are feeling.  Get help right away if you feel as though life is getting too tough to handle and you are thinking about suicide. This  information is not intended to replace advice given to you by your health care provider. Make sure you discuss any questions you have with your health care provider. Document Released: 09/30/2002 Document Revised: 07/17/2018 Document Reviewed: 11/06/2016 Elsevier Patient Education  2020 Elsevier Inc. Major Depressive Disorder, Adult Major depressive disorder (MDD) is a mental health condition. MDD often makes you feel sad, hopeless, or helpless. MDD can also cause symptoms in your body. MDD can affect your:  Work.  School.  Relationships.  Other normal activities. MDD can range from mild to very bad. It may occur once (single episode MDD). It can also occur many times (recurrent MDD). The main symptoms of MDD often include:  Feeling sad, depressed, or irritable most of the time.  Loss of interest. MDD symptoms also include:  Sleeping too much or too little.  Eating too much or too little.  A change in your weight.  Feeling tired (fatigue) or having low energy.  Feeling worthless.  Feeling guilty.  Trouble making decisions.  Trouble thinking clearly.  Thoughts of suicide or harming others.  Feeling weak.  Feeling agitated.  Keeping yourself from being around other people (isolation). Follow these instructions at home: Activity  Do these things as told by your doctor: ? Go back to your normal activities. ? Exercise regularly. ?  Spend time outdoors. Alcohol  Talk with your doctor about how alcohol can affect your antidepressant medicines.  Do not drink alcohol. Or, limit how much alcohol you drink. ? This means no more than 1 drink a day for nonpregnant women and 2 drinks a day for men. One drink equals one of these:  12 oz of beer.  5 oz of wine.  1 oz of hard liquor. General instructions  Take over-the-counter and prescription medicines only as told by your doctor.  Eat a healthy diet.  Get plenty of sleep.  Find activities that you enjoy. Make  time to do them.  Think about joining a support group. Your doctor may be able to suggest a group for you.  Keep all follow-up visits as told by your doctor. This is important. Where to find more information:  [employer] on Mental Illness: ? www.nami.[place]: ? https://carter.com/  National Suicide Prevention Lifeline: ? (507)106-3958. This is free, 24-hour help. Contact a doctor if:  Your symptoms get worse.  You have new symptoms. Get help right away if:  You self-harm.  You see, hear, taste, smell, or feel things that are not present (hallucinate). If you ever feel like you may hurt yourself or others, or have thoughts about taking your own life, get help right away. You can go to your nearest emergency department or call:  Your local emergency services (911 in the [place]  A suicide crisis helpline, such as the National Suicide Prevention Lifeline: ? (802) 388-3728. This is open 24 hours a day. This information is not intended to replace advice given to you by your health care provider. Make sure you discuss any questions you have with your health care provider. Document Released: 03/07/2015 Document Revised: 03/08/2017 Document Reviewed: 12/11/2015 Elsevier Patient Education  2020 [employer].

## 2019-01-20 NOTE — Progress Notes (Signed)
Subjective:  Natasha Garcia is a 28 y.o. female who presents to the [facility] today with a chief complaint of depression and anxiety.   HPI:  Patient states that she is having difficulty with anxiety and depression.  She has dealt with it for the past.  Last was in 2017.  She has been on multiple medications including Zoloft and Prozac.  She said that this did not help her in the past.  She is not currently taking any mood altering medications.  Patient says that she is using both marijuana and alcohol to help cope with her mood.  Says that she smokes 2-3 blunts a day.  She says she will drink 2-3 drinks at work or go through 1-2 bottles of liquor with friends.  Patient does report a family history of bipolar in her father.  Denies any hospitalizations for mental illness.  Does endorse passive suicidality, says that she would never act on it though.  Patient says that she does have access to a firearm.  Patient says that she is interested in trying Wellbutrin or venlafaxine.  ROS: Per HPI  PMH: Substance use described above   Objective:  Physical Exam: BP 124/78   Pulse 85   LMP 01/19/2019 (Exact Date)   SpO2 95%   Gen: NAD, resting comfortably, mildly tearful CV: RRR with no murmurs appreciated Pulm: NWOB, CTAB with no crackles, wheezes, or rhonchi GI: Normal bowel sounds present. Soft, Nontender, Nondistended. MSK: no edema, cyanosis, or clubbing noted Skin: warm, dry Neuro: grossly normal, moves all extremities Psych: Normal affect and thought content  PHQ9 SCORE ONLY 01/20/2019 04/11/2018 01/31/2018  Score 27 0 0   GAD 7 : Generalized Anxiety Score 01/20/2019 03/20/2017 01/11/2017  Nervous, Anxious, on Edge 3 3 3   Control/stop worrying 3 3 3   Worry too much - different things 3 3 3   Trouble relaxing 3 3 3   Restless 2 1 1   Easily annoyed or irritable 3 3 3   Afraid - awful might happen 1 1 2   Total GAD 7 Score 18 17 18       No results found for this or any previous visit  (from the past 72 hour(s)).   Assessment/Plan:  Anxiety and depression Patient presenting with history of anxiety and depression, currently on unmedicated.  Has tried SSRIs in the past, these have not worked well for her.  She is interested in trialing Wellbutrin.  She also wants to go to counseling.  We will refer her to psychology and start Wellbutrin.  Patient has fairly significant substance use including THC and alcohol.  Discussed the need to try to cut back, can follow while we are treating her mood disorder.  Patient has access to a firearm with passive suicidality.  Although she endorsed that she would never use a firearm or other means to hurt herself, recommended to patient that she find a safe home for it until she has better control of her mood.  Patient follow-up in 2 to 4 weeks with virtual visit to discuss mood with PCP.   Lab Orders  No laboratory test(s) ordered today    Meds ordered this encounter  Medications  . DISCONTD: buPROPion (WELLBUTRIN XL) 150 MG 24 hr tablet    Sig: Take 1 tablet (150 mg total) by mouth daily.    Dispense:  30 tablet    Refill:  0  . buPROPion (WELLBUTRIN XL) 150 MG 24 hr tablet    Sig: Take 1 tablet (150 mg  total) by mouth daily.    Dispense:  30 tablet    Refill:  0      Thomes Dinning, MD, MS FAMILY MEDICINE RESIDENT - PGY3  01/20/2019 4:29 PM

## 2019-01-20 NOTE — Assessment & Plan Note (Addendum)
Patient presenting with history of anxiety and depression, currently on unmedicated.  Has tried SSRIs in the past, these have not worked well for her.  She is interested in trialing Wellbutrin.  She also wants to go to counseling.  We will refer her to psychology and start Wellbutrin.  Patient has fairly significant substance use including THC and alcohol.  Discussed the need to try to cut back, can follow while we are treating her mood disorder.  Patient has access to a firearm with passive suicidality.  Although she endorsed that she would never use a firearm or other means to hurt herself, recommended to patient that she find a safe home for it until she has better control of her mood.  Patient follow-up in 2 to 4 weeks with virtual visit to discuss mood with PCP.

## 2019-01-30 ENCOUNTER — Other Ambulatory Visit: Payer: Self-pay

## 2019-01-30 DIAGNOSIS — Z20822 Contact with and (suspected) exposure to covid-19: Secondary | ICD-10-CM

## 2019-01-31 LAB — NOVEL CORONAVIRUS, NAA: SARS-CoV-2, NAA: NOT DETECTED

## 2019-02-12 ENCOUNTER — Other Ambulatory Visit: Payer: Self-pay

## 2019-02-12 DIAGNOSIS — Z20822 Contact with and (suspected) exposure to covid-19: Secondary | ICD-10-CM

## 2019-02-14 LAB — NOVEL CORONAVIRUS, NAA: SARS-CoV-2, NAA: NOT DETECTED

## 2019-03-02 ENCOUNTER — Other Ambulatory Visit: Payer: Self-pay

## 2019-03-02 DIAGNOSIS — Z20822 Contact with and (suspected) exposure to covid-19: Secondary | ICD-10-CM

## 2019-03-04 LAB — NOVEL CORONAVIRUS, NAA: SARS-CoV-2, NAA: NOT DETECTED

## 2019-04-24 ENCOUNTER — Other Ambulatory Visit: Payer: Self-pay

## 2019-04-24 ENCOUNTER — Ambulatory Visit (INDEPENDENT_AMBULATORY_CARE_PROVIDER_SITE_OTHER): Payer: BC Managed Care – PPO

## 2019-04-24 DIAGNOSIS — Z111 Encounter for screening for respiratory tuberculosis: Secondary | ICD-10-CM | POA: Diagnosis not present

## 2019-04-24 NOTE — Progress Notes (Signed)
Patient presents to office for PPD placement. PPD placed in left forearm. Patient will return 04/27/2019 to have PPD read.  Veronda Prude, RN

## 2019-04-27 ENCOUNTER — Other Ambulatory Visit: Payer: Self-pay

## 2019-04-27 ENCOUNTER — Ambulatory Visit (INDEPENDENT_AMBULATORY_CARE_PROVIDER_SITE_OTHER): Payer: BC Managed Care – PPO

## 2019-04-27 DIAGNOSIS — Z111 Encounter for screening for respiratory tuberculosis: Secondary | ICD-10-CM

## 2019-04-27 LAB — TB SKIN TEST
Induration: 0 mm
TB Skin Test: NEGATIVE

## 2019-04-27 NOTE — Progress Notes (Signed)
Patient is here for a PPD read.  It was placed on 04/24/2019 in the left forearm @ 09:00 am.    PPD RESULTS:  Result: negative Induration: 0 mm  Letter created and given to patient for documentation purposes. Veronda Prude, RN

## 2019-06-22 ENCOUNTER — Telehealth: Payer: Self-pay | Admitting: Family Medicine

## 2019-06-22 ENCOUNTER — Encounter: Payer: Self-pay | Admitting: Family Medicine

## 2019-06-22 ENCOUNTER — Other Ambulatory Visit: Payer: Self-pay

## 2019-06-22 ENCOUNTER — Ambulatory Visit (INDEPENDENT_AMBULATORY_CARE_PROVIDER_SITE_OTHER): Payer: BC Managed Care – PPO | Admitting: Family Medicine

## 2019-06-22 VITALS — BP 112/70 | HR 88 | Wt 135.8 lb

## 2019-06-22 DIAGNOSIS — Z7289 Other problems related to lifestyle: Secondary | ICD-10-CM

## 2019-06-22 DIAGNOSIS — F419 Anxiety disorder, unspecified: Secondary | ICD-10-CM

## 2019-06-22 DIAGNOSIS — F329 Major depressive disorder, single episode, unspecified: Secondary | ICD-10-CM

## 2019-06-22 DIAGNOSIS — F32A Depression, unspecified: Secondary | ICD-10-CM

## 2019-06-22 DIAGNOSIS — F1090 Alcohol use, unspecified, uncomplicated: Secondary | ICD-10-CM

## 2019-06-22 DIAGNOSIS — F1220 Cannabis dependence, uncomplicated: Secondary | ICD-10-CM | POA: Diagnosis not present

## 2019-06-22 DIAGNOSIS — Z789 Other specified health status: Secondary | ICD-10-CM

## 2019-06-22 NOTE — Progress Notes (Signed)
    SUBJECTIVE:   CHIEF COMPLAINT / HPI:   [name] is a 29 yo female presenting today for a medical disability form completion for testing accommodations.  Depression/Anxiety Needs testing accommodations was failing in undergrad without them. When she had no timed tests or extended time test she began to better grades.  She is now taking an online course and expresses that timed exams increases increase her anxiety.  She has not been taking the Wellbutrin on a regular basis.  She also did not follow-up with the therapy resources that was given with her prior visit with Dr. Janee Morn 01/20/2019.  Does not endorse suicidal ideation today.  Substance abuse disorder Continues to drink alcohol on a regular basis but endorses that she has cut back considerably. Prior history of 2-3 drinks at work. She no longer visits bars as much during her work breaks.  She continues to endorse THC use daily. Prior history of 2-3 blunts a day.  PERTINENT  PMH / PSH: Mood disorder, passive suicidal ideation  OBJECTIVE:   BP 112/70   Pulse 88   Wt 135 lb 12.8 oz (61.6 kg)   LMP 05/25/2019 (Approximate)   SpO2 100%   BMI 21.92 kg/m    General: Appears well, no acute distress. Age appropriate. Cardiac: RRR, normal heart sounds, no murmurs Respiratory: CTAB, normal effort PHQ9 SCORE ONLY 06/22/2019 06/22/2019 01/20/2019  Score 18 4 27    GAD 7 : Generalized Anxiety Score 06/22/2019 01/20/2019 03/20/2017 01/11/2017  Nervous, Anxious, on Edge 2 3 3 3   Control/stop worrying 2 3 3 3   Worry too much - different things 2 3 3 3   Trouble relaxing 3 3 3 3   Restless 2 2 1 1   Easily annoyed or irritable 2 3 3 3   Afraid - awful might happen 0 1 1 2   Total GAD 7 Score 13 18 17 18   Anxiety Difficulty Somewhat difficult - - -   ASSESSMENT/PLAN:   Tetrahydrocannabinol (THC) use disorder, moderate, dependence (HCC) Continues to smoke daily. Discussed cessation benefits for improved depression and anxiety.    -Counseled on smoking cessation  Alcohol use Prior history of 2-3 drinks while working. Continues to endorse frequenting bars while working but not as much. Patient counseled on decreasing alcohol use and the benefits this will have on her mood. She voiced understanding. -Encouraged to stop drinking alcohol  Anxiety and depression PHQ 18. No suicidal ideation. GAD7 13. Prescribed Wellbutrin at prior visit but not taking medication. Has not followed up with therapy resources. Encouraged patient to restart wellbutrin and take daily. Encouraged patient to follow up on therapy resources.  -Restart Wellbutrin 150mg  daily -Follow up with PCP as soon as possible.  Disability forms: Patient encouraged to make appointment to see PCP as soon as possible for accommodations forms to be filled out and to follow up for a mood check. Her online class ends this month so it appears that she is seeking accommodations late for this particular class.     03/13/2017, DO [facility]

## 2019-06-22 NOTE — Telephone Encounter (Signed)
Reviewed Medical Disability Verification form and placed in PCP's box for completion.  Glennie Hawk, CMA

## 2019-06-22 NOTE — Assessment & Plan Note (Signed)
PHQ 18. No suicidal ideation. GAD7 13. Prescribed Wellbutrin at prior visit but not taking medication. Has not followed up with therapy resources. Encouraged patient to restart wellbutrin and take daily. Encouraged patient to follow up on therapy resources.  -Restart Wellbutrin 150mg  daily -Follow up with PCP as soon as possible.

## 2019-06-22 NOTE — Telephone Encounter (Signed)
Medical Disability Vertification form dropped off for at front desk for completion.  Verified that patient section of form has been completed.  Last DOS/WCC with PCP was 06-22-19.  Placed form in RED team folder to be completed by clinical staff.  Natasha Garcia

## 2019-06-22 NOTE — Assessment & Plan Note (Signed)
Continues to smoke daily. Discussed cessation benefits for improved depression and anxiety.  -Counseled on smoking cessation

## 2019-06-22 NOTE — Assessment & Plan Note (Signed)
Prior history of 2-3 drinks while working. Continues to endorse frequenting bars while working but not as much. Patient counseled on decreasing alcohol use and the benefits this will have on her mood. She voiced understanding. -Encouraged to stop drinking alcohol

## 2019-06-24 NOTE — Telephone Encounter (Signed)
Patient needs appointment with me to discuss these forms. Can be virtual or she can see another provider.   Thanks Latrelle Dodrill, MD

## 2019-06-25 ENCOUNTER — Other Ambulatory Visit: Payer: Self-pay

## 2019-06-25 ENCOUNTER — Encounter: Payer: Self-pay | Admitting: Family Medicine

## 2019-06-25 ENCOUNTER — Ambulatory Visit (INDEPENDENT_AMBULATORY_CARE_PROVIDER_SITE_OTHER): Payer: BC Managed Care – PPO | Admitting: Family Medicine

## 2019-06-25 DIAGNOSIS — F418 Other specified anxiety disorders: Secondary | ICD-10-CM | POA: Diagnosis not present

## 2019-06-25 NOTE — Assessment & Plan Note (Signed)
To be test taking anxiety.  Anxiety is significantly related to her test and the timing of these test.  In the past she has been able to have accommodations and this significantly improved her grades.  Given that patient is currently failing due to her anxiety I think it is reasonable to provide these accommodations.  Test taking disability forms provided for patient.  In these forms I have listed that she has test anxiety and that she would require extended time due to this anxiety.  Advised to continue to follow-up with PCP for anxiety.

## 2019-06-25 NOTE — Progress Notes (Signed)
    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  Was recently seen on 3/15 for this by Dr. Salvadore Dom.  Patient is requesting test accommodations at school so that she may have extended time.  States that when she was in [place] she had these accommodations due to her test anxiety.  Prior to accommodation she was failing her test.  The timing of test gives her extreme anxiety.  States that she gets distracted by those around her and then that stresses her out even more.  Is unable to focus.  When she was able to get extended time her no time limit she was able to focus more and have less anxiety.  Her grades then went to a's and B's after accommodations.  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  It is mainly on line but she is still having test taking anxiety.  States that this was the first class she took with timing and she started to do poorly due to anxiety.  She gets stressed right before every exam thinking about her anxiety. Denies SI/HI.   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  Anxiety is significantly related to her test and the timing of these test.  In the past she has been able to have accommodations and this significantly improved her grades.  Given that patient is currently failing due to her anxiety I think it is reasonable to provide these accommodations.  Test taking disability forms provided for patient.  In these forms I have listed that she has test anxiety and that she would require extended time due to this anxiety.  Advised to continue to follow-up with PCP for anxiety.     , DO [facility]

## 2019-06-25 NOTE — Telephone Encounter (Signed)
Contacted patient. PCP does not have any availability until April 1st. Patient stated abruptly, "No, I can not wait that long." Per PCP note, ok to schedule with another provider. Scheduled with Darin Engels this afternoon.

## 2019-06-25 NOTE — Patient Instructions (Signed)
Therapy and Counseling Resources Most providers on this list will take Medicaid. Patients with commercial insurance or Medicare should contact their insurance company to get a list of in network providers.  [facility]  [address]   [place], [place] [address]      [phone]  [facility] [address]  [place], [place] [address]       [phone]     [facility] [address], [place], [place]  [phone]    [facility] [address], [place], [place] [phone]  [facility]:  [address] [place] [phone]  [facility]:  [address], [place] [phone]  [facility] (under & uninsured) [address]   [place] [phone]    [email or web address]    [facility] [place] -[address]     Phone:  [phone]     [place]-  [address]  [phone]   [facility] [address]      [place], [place] [phone]  [facility]: [address], [place], [place]  [phone]   [facility] (Spanish therapist) [address]   [place] [address]    [phone]    [facility]   [address],  [place], [place]  [phone]   [facility]  [address] [place]  [phone]  [facility]  [address] [place], [place]        [phone]  Open Access/Walk In Clinic under & uninsured [facility],  [address], [place] ([phone]):  Mon - Fri from 8 AM - 3 PM  [facility] [place],  (Spanish)   [address], [place]: ([phone]) 8:30 - 12; 1 - 2:30  [facility],  [address], [place]    ([phone]):8:30 - 12; 2 - 3PM  [facility] [place],  [address],  [place]; ([phone]):   Mon - Fri 8 AM - 5 PM  [facility] [address] [place]  MWF 12:30 to 3:00 or call to schedule an appointment  [phone]  Specific Provider options Psychology Today  https://www.psychologytoday.com/us 1. click on find a therapist  2. enter your zip code 3. left side and select or tailor a therapist for your specific need.   [facility] Provider Directory [email or web address]  (Medicaid)   Follow all drop down to find a provider  Social Support program [facility] [place] [phone] or www.[email or web address] [address], [place], [place] Recovery support and educational   In home counseling [facility] Telephone: [phone]  office in [place] [email or web address]   Does not take reg. Medicaid or Medicare private insurance BCCS, [place] health Choice, [facility], [employer], [employer], [employer], [place] Health Choice  24- Hour Availability:  . [facility]   [phone] or 1-800-711-2635  . [facility]  Crisis Line [phone]  [facility]  [phone]   . [facility] [place] [facility]  1-866-261-5769 (after hours)  . Therapeutic Alternative/Mobile Crisis   1-877-626-1772  . [place] National Suicide Hotline  1-800-273-8255 (TALK)  . Call 911 or go to emergency room  . [facility] Crisis [facility]  (800-256-2452);  [place] and [place]   .   [facility]  ([phone]); [place], [place], [place], [place], [place], [place], [place]  

## 2019-09-23 ENCOUNTER — Other Ambulatory Visit: Payer: Self-pay | Admitting: *Deleted

## 2019-09-23 MED ORDER — VALACYCLOVIR HCL 1 G PO TABS: 1000.0000 mg | ORAL_TABLET | Freq: Every day | ORAL | 0 refills | Status: DC

## 2019-10-21 ENCOUNTER — Ambulatory Visit: Payer: BC Managed Care – PPO | Admitting: Family Medicine

## 2019-10-27 ENCOUNTER — Ambulatory Visit: Payer: BC Managed Care – PPO | Admitting: Family Medicine

## 2019-11-13 ENCOUNTER — Other Ambulatory Visit: Payer: Self-pay

## 2019-11-13 ENCOUNTER — Encounter: Payer: Self-pay | Admitting: Family Medicine

## 2019-11-13 ENCOUNTER — Other Ambulatory Visit (HOSPITAL_COMMUNITY)
Admission: RE | Admit: 2019-11-13 | Discharge: 2019-11-13 | Disposition: A | Discharge status: Home / Self Care | Payer: BC Managed Care – PPO | Source: Ambulatory Visit | Attending: Family Medicine | Admitting: Family Medicine

## 2019-11-13 ENCOUNTER — Ambulatory Visit (INDEPENDENT_AMBULATORY_CARE_PROVIDER_SITE_OTHER): Payer: BC Managed Care – PPO | Admitting: Family Medicine

## 2019-11-13 VITALS — HR 94 | Ht 66.5 in | Wt 140.8 lb

## 2019-11-13 DIAGNOSIS — F320 Major depressive disorder, single episode, mild: Secondary | ICD-10-CM

## 2019-11-13 DIAGNOSIS — Z113 Encounter for screening for infections with a predominantly sexual mode of transmission: Secondary | ICD-10-CM | POA: Diagnosis present

## 2019-11-13 DIAGNOSIS — B9689 Other specified bacterial agents as the cause of diseases classified elsewhere: Secondary | ICD-10-CM

## 2019-11-13 DIAGNOSIS — N760 Acute vaginitis: Secondary | ICD-10-CM | POA: Diagnosis not present

## 2019-11-13 DIAGNOSIS — Z0000 Encounter for general adult medical examination without abnormal findings: Secondary | ICD-10-CM | POA: Diagnosis not present

## 2019-11-13 DIAGNOSIS — F32A Depression, unspecified: Secondary | ICD-10-CM

## 2019-11-13 LAB — POCT WET PREP (WET MOUNT)
Clue Cells Wet Prep Whiff POC: POSITIVE
Trichomonas Wet Prep HPF POC: ABSENT

## 2019-11-13 LAB — POCT URINE PREGNANCY: Preg Test, Ur: NEGATIVE

## 2019-11-13 MED ORDER — METRONIDAZOLE 500 MG PO TABS: 500.0000 mg | ORAL_TABLET | Freq: Two times a day (BID) | ORAL | 0 refills | Status: AC

## 2019-11-13 NOTE — Assessment & Plan Note (Addendum)
Asymptomatic routine screening.  Will follow-up HIV, RPR, hep C, gc/ch, and trichomonas. Recommended routinely using condoms for contraception and barrier to STDs, patient not interested in alternative contraception at this time.

## 2019-11-13 NOTE — Assessment & Plan Note (Signed)
PHQ-9 score of 6 today, no SI/HI.  Has been trying to establish with therapy, waiting on callbacks.  Fortunately already has counseling resources at home from previous visit, will continue to call.  Encouraged maintaining kickboxing as this has been helpful, and trialing meditation/yoga.  Aware of crisis hotline.

## 2019-11-13 NOTE — Patient Instructions (Addendum)
It was wonderful to meet you today.  Please keep trying to call the therapy resources in the community, if you continue to have trouble the next several weeks please give [place] a call.  Additionally, I encourage you to use at least barrier protection when sexually active to decrease risk of sexually transmitted disease and unwanted pregnancy.  I recommend you take a prenatal vitamin daily.  I will call you with the results of your STD screening.  Please follow-up in 3 months to check in to see how you are doing or sooner if needed.

## 2019-11-13 NOTE — Progress Notes (Signed)
SUBJECTIVE:   CHIEF COMPLAINT / HPI: Physical and Pap smear  Natasha Garcia is a 29 year old female presenting for her annual physical.  She has no concerns today.  Exercise: Does kickboxing at least twice a week, helps with her mood Safe in relationship: Yes Mood: Mild depression, see below Work: works as Lawyer, very busy, in school for masters of public health.  Social: THC use, occasional glass of wine.  Non-smoker.  GYN: G1 P1001 Menstrual cycle: Currently bleeding for the past 2 days, took a "day after Plan B" pill on Sunday. Last LMP 7/11. Last sexually active last Saturday 7/31. Does not use barrier protection. Pap smear: UTD, due in 10/2020.  Last in 10/2017 and normal, Pap smear prior to this also normal. Contraception: None, worries about contraception side effects to her body. Screen for STDs: yes, however no recent changes in vaginal discharge, itching/irritation, monogamous with female partner  Health maintenance: Due for Covid vaccine, not interested  Family history: grandmother had lung cancer. No early breast, ovarian, or cervical cancer.  No early CAD or sudden death.    Office Visit from December 08, 2019 in [facility] Office Visit from 06/25/2019 in [facility] Office Visit from 06/22/2019 in [facility]  Thoughts that you would be better off dead, or of hurting yourself in some way Not at all Not at all Not at all  PHQ-9 Total Score 6 11 18     She is try to establish therapy, call to place this week and is waiting for callback.  Already has a list of counseling resources in the community at home.  She does feel like kickboxing has helped.  Has tried a little bit of meditation as well.  She is stressed with working and going to school.  PERTINENT  PMH / PSH: Paroxysmal supraventricular tachycardia, mild scoliosis, history of BV/trichomonas, THC use  OBJECTIVE:   Pulse 94   Ht 5' 6.5" (1.689 m)   Wt 140 lb 12.8 oz  (63.9 kg)   SpO2 98%   BMI 22.39 kg/m   General: Alert, NAD HEENT: NCAT, MMM Lungs: No increased WOB  Abdomen: soft, non-tender Msk: Moves all extremities spontaneously, normal gait   Ext: Warm, dry Psych: Normal mood and affect Pelvic exam: VULVA: normal appearing vulva with no masses, tenderness or lesions, VAGINA: normal appearing vagina with normal color with scant white cream-like discharge, no lesions, CERVIX: normal appearing cervix without discharge or lesions.  Urine pregnancy test negative today.  ASSESSMENT/PLAN:   Annual physical exam Reviewed past medical, surgical, and social history.  Medications updated in epic.  Health maintenance reviewed and due for COVID vaccine, declines at this time. Encouraged working towards a well-balanced diet and staying physically active as she has been doing.  Recommended routinely using condoms for contraception and barrier to STDs, patient not interested in alternative contraception at this time.  Mild depression (HCC) PHQ-9 score of 6 today, no SI/HI.  Has been trying to establish with therapy, waiting on callbacks.  Fortunately already has counseling resources at home from previous visit, will continue to call.  Encouraged maintaining kickboxing as this has been helpful, and trialing meditation/yoga.  Aware of crisis hotline.   Screening examination for STD (sexually transmitted disease) Asymptomatic routine screening.  Will follow-up HIV, RPR, hep C, gc/ch, and trichomonas.     Follow-up in 3 months to check in or sooner if needed.  , DO [facility]

## 2019-11-13 NOTE — Assessment & Plan Note (Addendum)
Reviewed past medical, surgical, and social history.  Medications updated in epic.  Health maintenance reviewed and due for COVID vaccine, declines at this time. Encouraged working towards a well-balanced diet and staying physically active as she has been doing.

## 2019-11-14 LAB — HIV ANTIBODY (ROUTINE TESTING W REFLEX): HIV Screen 4th Generation wRfx: NONREACTIVE

## 2019-11-14 LAB — RPR: RPR Ser Ql: NONREACTIVE

## 2019-11-14 LAB — HEPATITIS C ANTIBODY: Hep C Virus Ab: <0.1 s/co ratio (ref 0.0–0.9)

## 2019-11-16 LAB — CERVICOVAGINAL ANCILLARY ONLY
Chlamydia: POSITIVE — AB
Comment: NEGATIVE
Comment: NEGATIVE
Comment: NORMAL
Neisseria Gonorrhea: NEGATIVE
Trichomonas: NEGATIVE

## 2019-11-17 ENCOUNTER — Telehealth: Payer: Self-pay

## 2019-11-17 ENCOUNTER — Other Ambulatory Visit: Payer: Self-pay | Admitting: Family Medicine

## 2019-11-17 DIAGNOSIS — A749 Chlamydial infection, unspecified: Secondary | ICD-10-CM

## 2019-11-17 MED ORDER — DOXYCYCLINE HYCLATE 100 MG PO TABS: 100.0000 mg | ORAL_TABLET | Freq: Two times a day (BID) | ORAL | 0 refills | Status: AC

## 2019-11-17 NOTE — Telephone Encounter (Signed)
Reached patient to discuss results.  Will Rx doxycycline x7 days.  Patient had already informed significant other and he has received treatment.  She would like to come back in 1 month to ensure test of cure.  Allayne Stack, DO

## 2019-11-17 NOTE — Telephone Encounter (Signed)
Patient returns call to nurse line regarding recent test results. Patient asking about medication being sent into pharmacy. Please advise treatment course for patient. Patient can be reached at [phone].   To Dr. Carmon Sails, RN

## 2019-11-17 NOTE — Telephone Encounter (Signed)
Pt LVM on nurse line regarding receiving treatment, per test results.   To PCP and Dr. Carmon Sails, RN

## 2020-01-19 DIAGNOSIS — S0091XA Abrasion of unspecified part of head, initial encounter: Secondary | ICD-10-CM | POA: Diagnosis not present

## 2020-01-19 DIAGNOSIS — R5381 Other malaise: Secondary | ICD-10-CM | POA: Diagnosis not present

## 2020-02-19 ENCOUNTER — Telehealth: Payer: Self-pay

## 2020-02-19 NOTE — Telephone Encounter (Signed)
Patient needs an appointment to be seen before I can prescribe any controlled substances (even a one-time dose) or muscle relaxers. She can schedule with any provider here in the clinic since I do not have openings.  Thanks Latrelle Dodrill, MD

## 2020-02-19 NOTE — Telephone Encounter (Signed)
LVM for patient to call clinic to schedule an appointment with any provider for RX's.  Glennie Hawk, CMA
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
## 2020-02-22 NOTE — Telephone Encounter (Signed)
Patient returns call to nurse line. Patient prefers a Wednesday apt, as she is off work. Scheduled for 11/17.
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
## 2020-02-24 ENCOUNTER — Encounter: Payer: Self-pay | Admitting: Family Medicine

## 2020-02-24 ENCOUNTER — Other Ambulatory Visit: Payer: Self-pay

## 2020-02-24 ENCOUNTER — Other Ambulatory Visit (HOSPITAL_COMMUNITY)
Admission: RE | Admit: 2020-02-24 | Discharge: 2020-02-24 | Disposition: A | Discharge status: Home / Self Care | Payer: BC Managed Care – PPO | Source: Ambulatory Visit | Attending: Family Medicine | Admitting: Family Medicine

## 2020-02-24 ENCOUNTER — Ambulatory Visit (INDEPENDENT_AMBULATORY_CARE_PROVIDER_SITE_OTHER): Payer: BC Managed Care – PPO | Admitting: Family Medicine

## 2020-02-24 VITALS — BP 115/70 | HR 81 | Ht 67.0 in | Wt 141.0 lb

## 2020-02-24 DIAGNOSIS — Z113 Encounter for screening for infections with a predominantly sexual mode of transmission: Secondary | ICD-10-CM

## 2020-02-24 DIAGNOSIS — F418 Other specified anxiety disorders: Secondary | ICD-10-CM

## 2020-02-24 DIAGNOSIS — M545 Low back pain, unspecified: Secondary | ICD-10-CM | POA: Insufficient documentation

## 2020-02-24 LAB — POCT WET PREP (WET MOUNT)
Clue Cells Wet Prep Whiff POC: NEGATIVE
Trichomonas Wet Prep HPF POC: ABSENT

## 2020-02-24 MED ORDER — CYCLOBENZAPRINE HCL 10 MG PO TABS
10.0000 mg | ORAL_TABLET | Freq: Every day | ORAL | 0 refills | Status: DC | PRN
Start: 2020-02-24 — End: 2020-11-05

## 2020-02-24 MED ORDER — LORAZEPAM 0.5 MG PO TABS: ORAL_TABLET | ORAL | 0 refills | Status: DC
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
## 2020-02-24 NOTE — Assessment & Plan Note (Signed)
Sexually active. No symptoms.  Wet prep negative for BV/yeast. Will f/u GC testing.

## 2020-02-24 NOTE — Patient Instructions (Signed)
It was nice to see you today,  I have prescribed you Ativan to use on the day of your procedure.  They are half milligram tablets.  You should take 2 tablets 90 min prior to the procedure.  If it is less than 15 min prior to the procedure and you are still feeling anxious you can take the third tablet.  I have prescribed you 20 tablets of Flexeril to use for your back pain as needed.  Please do not take more than 2 in a day.  Do not take this medication on the same day that you take the Ativan.  Have a great day,  Frederic Jericho, MD

## 2020-02-24 NOTE — Assessment & Plan Note (Signed)
Anxiety associated with being in MRI machine.  Unable to tolerate test previously.  Will prescribe 1mg  ativan 90 minutes prior to procedure and 0.5mg  if needed immediately prior to procedure.

## 2020-02-25 LAB — CERVICOVAGINAL ANCILLARY ONLY
Chlamydia: NEGATIVE
Comment: NEGATIVE
Comment: NORMAL
Neisseria Gonorrhea: NEGATIVE

## 2020-04-06 ENCOUNTER — Encounter: Payer: Self-pay | Admitting: Family Medicine

## 2020-04-15 ENCOUNTER — Ambulatory Visit: Payer: BC Managed Care – PPO | Admitting: Family Medicine

## 2020-08-17 ENCOUNTER — Other Ambulatory Visit: Payer: Self-pay | Admitting: Family Medicine

## 2020-09-19 NOTE — Progress Notes (Addendum)
    SUBJECTIVE:   CHIEF COMPLAINT / HPI: STI testing  Vaginal discharge Reports increased discharge without any odor for about 1 week. No odor. Some vaginal discomfort. Denies vaginal itching and bleeding. LMP end of May, usually lasts 7-10 days. Has been having unprotected intercourse. Same partner for 2.5 years, recently found out he cheated. Declines pregnancy testing. Does not want HIV or syphilis testing. Requests pap smear.  Depression Patient states she has struggled with depression for many years. Has tried therapy in the past but is not currently going to therapy. She is interested in going to therapy. Has never been on medications. Denies SI.   PERTINENT  PMH / PSH: history of BV, trichomonas  OBJECTIVE:   BP 106/60   Wt 142 lb (64.4 kg)   LMP 08/06/2020 (Exact Date)   BMI 22.24 kg/m   General: Young female, NAD Pelvic exam: on speculum exam, small amount of thin white discharge noted in vaginal canal. Cervix appears normal.  Chaperone Jessica Fleeger CMA present  Depression screen [facility] 2/9 09/20/2020  Decreased Interest 2  Down, Depressed, Hopeless 2  PHQ - 2 Score 4  Altered sleeping 2  Tired, decreased energy 2  Change in appetite 2  Feeling bad or failure about yourself  1  Trouble concentrating 1  Moving slowly or fidgety/restless 0  Suicidal thoughts 0  PHQ-9 Score 12  Difficult doing work/chores -  Some recent data might be hidden     ASSESSMENT/PLAN:   Mild depression (HCC) Denies SI. Therapy resources given per patient request.   Vaginal discharge Wet prep shows BV. - metronidazole BID x 7d - GC/chlamydia pending - HIV, RPR declined  HCM - pap smear done today  Littie Deeds, MD [facility]

## 2020-09-19 NOTE — Patient Instructions (Addendum)
It was nice seeing you today!  You have BV which is not a STI. Take metronidazole twice a day for 7 days.  I will update you with the rest of the results when available.  Stay well, Littie Deeds, MD [facility] [phone]    Therapy and Counseling Resources Most providers on this list will take Medicaid. Patients with commercial insurance or Medicare should contact their insurance company to get a list of in network providers.  [facility]iatry and Counseling [address] [place], [place] [address] [phone]  [facility]  [address], Suite [place], [place] [address]      [phone]  [facility] [address]  [place], [place] [address] [phone]  [facility] [address]  [place], [place] [address]       [phone]     [facility] (virtual only) [phone]  [facility] [address] [place], [place] [phone]  [facility]:  [address] [place] [phone]  [facility]:  [address] [place] [phone]  [facility] (under & uninsured) [address]   [employer] [place] [phone]    [email or web address]    [facility] [address]  [place]    [phone]  [facility] -[address]     Phone:  [phone]     [facility]-  [address]  [phone]   [facility] [address]      [place], [place] [phone]  [facility]: [address] [place], [place], [place]  [phone]   [facility] (Spanish therapist) [email or web address]  [address] [place]  [address]   [place] [address]    [phone]    [facility]   [address],  [place], [place]  [phone]   [facility]  [address] [place]  [phone]  [facility]  [address] [place], [place]        [phone]  Open Access/Walk In Clinic under & uninsured  [facility]  [address] [place], [place] [phone] Crisis [phone]  [facility] [place],  (Spanish)   [address], [place]: [phone]) 8:30 - 12; 1 - 2:30  [facility] [employer],  [address] [place], [place]    [phone]):8:30 - 12; 2 - 3PM  [facility] [employer],  [address]  [place]; [phone]):   Mon - Fri 8 AM - 5 PM  [facility] [address] [place]  MWF 12:30 to 3:00 or call to schedule an appointment  [phone]  Specific Provider options Psychology Today  https://www.psychologytoday.com/us click on find a therapist  enter your zip code left side and select or tailor a therapist for your specific need.   [facility] Provider Directory [email or web address]  (Medicaid)   Follow all drop down to find a provider  Social Support program Mental Health [place] [phone] or [employer] [address], [place], [place] Recovery support and educational   24- Hour Availability:   [facility]  [address] [place], [place] [phone] Crisis [phone]  [facility] [phone]  [facility] Crisis Service  [phone]   [facility] [employer]  [phone] (after hours)  Therapeutic Alternative/Mobile Crisis   [phone]  [place] National Suicide Hotline  563-149-8609 Len Childs)  Call 911 or go to emergency room  [place]  [phone]);  [place] and [employer]  [phone]); [place], [place], [place], [place], [place], [place], [place]

## 2020-09-20 ENCOUNTER — Other Ambulatory Visit (HOSPITAL_COMMUNITY)
Admission: RE | Admit: 2020-09-20 | Discharge: 2020-09-20 | Disposition: A | Discharge status: Home / Self Care | Payer: BC Managed Care – PPO | Source: Ambulatory Visit | Attending: Family Medicine | Admitting: Family Medicine

## 2020-09-20 ENCOUNTER — Ambulatory Visit (INDEPENDENT_AMBULATORY_CARE_PROVIDER_SITE_OTHER): Payer: BC Managed Care – PPO | Admitting: Family Medicine

## 2020-09-20 ENCOUNTER — Other Ambulatory Visit: Payer: Self-pay

## 2020-09-20 ENCOUNTER — Encounter: Payer: Self-pay | Admitting: Family Medicine

## 2020-09-20 VITALS — BP 106/60 | Wt 142.0 lb

## 2020-09-20 DIAGNOSIS — F32A Depression, unspecified: Secondary | ICD-10-CM

## 2020-09-20 DIAGNOSIS — Z124 Encounter for screening for malignant neoplasm of cervix: Secondary | ICD-10-CM

## 2020-09-20 DIAGNOSIS — B9689 Other specified bacterial agents as the cause of diseases classified elsewhere: Secondary | ICD-10-CM

## 2020-09-20 DIAGNOSIS — N898 Other specified noninflammatory disorders of vagina: Secondary | ICD-10-CM

## 2020-09-20 DIAGNOSIS — F320 Major depressive disorder, single episode, mild: Secondary | ICD-10-CM

## 2020-09-20 DIAGNOSIS — N760 Acute vaginitis: Secondary | ICD-10-CM

## 2020-09-20 LAB — POCT WET PREP (WET MOUNT)
Clue Cells Wet Prep Whiff POC: NEGATIVE
Trichomonas Wet Prep HPF POC: ABSENT

## 2020-09-20 MED ORDER — METRONIDAZOLE 500 MG PO TABS: 500.0000 mg | ORAL_TABLET | Freq: Two times a day (BID) | ORAL | 0 refills | Status: AC

## 2020-09-20 NOTE — Assessment & Plan Note (Signed)
Denies SI. Therapy resources given per patient request.

## 2020-09-21 LAB — CERVICOVAGINAL ANCILLARY ONLY
Chlamydia: NEGATIVE
Comment: NEGATIVE
Comment: NORMAL
Neisseria Gonorrhea: NEGATIVE

## 2020-09-22 LAB — CYTOLOGY - PAP: Diagnosis: NEGATIVE

## 2020-10-19 ENCOUNTER — Ambulatory Visit (INDEPENDENT_AMBULATORY_CARE_PROVIDER_SITE_OTHER): Payer: BC Managed Care – PPO | Admitting: Family Medicine

## 2020-10-19 ENCOUNTER — Encounter: Payer: Self-pay | Admitting: Family Medicine

## 2020-10-19 ENCOUNTER — Other Ambulatory Visit (HOSPITAL_COMMUNITY)
Admission: RE | Admit: 2020-10-19 | Discharge: 2020-10-19 | Disposition: A | Discharge status: Home / Self Care | Payer: BC Managed Care – PPO | Source: Ambulatory Visit | Attending: Family Medicine | Admitting: Family Medicine

## 2020-10-19 VITALS — BP 102/76 | HR 94 | Ht 67.0 in | Wt 140.8 lb

## 2020-10-19 DIAGNOSIS — Z111 Encounter for screening for respiratory tuberculosis: Secondary | ICD-10-CM

## 2020-10-19 DIAGNOSIS — Z113 Encounter for screening for infections with a predominantly sexual mode of transmission: Secondary | ICD-10-CM | POA: Diagnosis not present

## 2020-10-19 LAB — POCT WET PREP (WET MOUNT)
Clue Cells Wet Prep Whiff POC: POSITIVE
Trichomonas Wet Prep HPF POC: ABSENT

## 2020-10-19 LAB — POCT URINALYSIS DIP (MANUAL ENTRY)
Bilirubin, UA: NEGATIVE
Blood, UA: NEGATIVE
Glucose, UA: NEGATIVE mg/dL
Ketones, POC UA: NEGATIVE mg/dL
Leukocytes, UA: NEGATIVE
Nitrite, UA: NEGATIVE
Protein Ur, POC: NEGATIVE mg/dL
Spec Grav, UA: 1.015 (ref 1.010–1.025)
Urobilinogen, UA: 0.2 E.U./dL
pH, UA: 6.5 (ref 5.0–8.0)

## 2020-10-19 LAB — POCT URINE PREGNANCY: Preg Test, Ur: NEGATIVE

## 2020-10-19 MED ORDER — METRONIDAZOLE 500 MG PO TABS: 500.0000 mg | ORAL_TABLET | Freq: Two times a day (BID) | ORAL | 0 refills | Status: AC

## 2020-10-19 NOTE — Progress Notes (Signed)
    SUBJECTIVE:   CHIEF COMPLAINT / HPI: STI testing  Requesting STI testing.  New partner on July 4th.  No condom use. Regular partner on the 9th July and used condom.  Unknown if partners have any symptoms. Reports  vaginal discomfort and some discharge. Denies any urinary symptoms, fevers or abnormal vaginal bleeding.  LMP 07/20. Not on birth control and does not desire pregnancy.   PERTINENT  PMH / PSH:  Previous STI  OBJECTIVE:   BP 102/76   Pulse 94   Ht 5\' 7"  (1.702 m)   Wt 140 lb 12.8 oz (63.9 kg)   LMP 10/04/2020 (Exact Date)   SpO2 98%   BMI 22.05 kg/m    General: Alert, no acute distress Pelvic Exam chaperoned by [facility]  April        External: normal female genitalia without lesions or masses        Vagina: normal without lesions or masses, moderate amount of thin yellow discharge in vaginal vault        Cervix: normal without lesions or masses            ASSESSMENT/PLAN:   Screening examination for STD (sexually transmitted disease) UPT negative BCP/IUD/Nexplanon/Condoms/Tubal for contraception discussed and patient declined. Encouraged Folic acid 0.4mg  daily Samples for Wet prep, GC/Chlamydia obtained HIV, RPR Positive for BV, Flagy 500 mg BID x 7 days Follow up with PCP as needed    TB screening for work -TB Gold plus  May, MD [facility]

## 2020-10-20 ENCOUNTER — Ambulatory Visit: Payer: BC Managed Care – PPO | Admitting: Family Medicine

## 2020-10-20 ENCOUNTER — Encounter (INDEPENDENT_AMBULATORY_CARE_PROVIDER_SITE_OTHER): Payer: Self-pay | Admitting: Family Medicine

## 2020-10-20 LAB — HIV ANTIBODY (ROUTINE TESTING W REFLEX): HIV Screen 4th Generation wRfx: NONREACTIVE

## 2020-10-20 LAB — CERVICOVAGINAL ANCILLARY ONLY
Chlamydia: NEGATIVE
Comment: NEGATIVE
Comment: NORMAL
Neisseria Gonorrhea: NEGATIVE

## 2020-10-20 LAB — RPR: RPR Ser Ql: NONREACTIVE

## 2020-10-23 ENCOUNTER — Encounter: Payer: Self-pay | Admitting: Family Medicine

## 2020-10-23 NOTE — Assessment & Plan Note (Signed)
UPT negative BCP/IUD/Nexplanon/Condoms/Tubal for contraception discussed and patient declined. Encouraged Folic acid 0.4mg  daily Samples for Wet prep, GC/Chlamydia obtained HIV, RPR Positive for BV, Flagy 500 mg BID x 7 days Follow up with PCP as needed

## 2020-10-24 LAB — QUANTIFERON-TB GOLD PLUS
QuantiFERON Mitogen Value: >10 IU/mL
QuantiFERON Nil Value: 0.03 IU/mL
QuantiFERON TB1 Ag Value: 0.04 IU/mL
QuantiFERON TB2 Ag Value: 0.04 IU/mL
QuantiFERON-TB Gold Plus: NEGATIVE

## 2020-11-05 ENCOUNTER — Other Ambulatory Visit: Payer: Self-pay

## 2020-11-05 ENCOUNTER — Encounter (HOSPITAL_COMMUNITY): Payer: Self-pay

## 2020-11-05 ENCOUNTER — Ambulatory Visit (HOSPITAL_COMMUNITY)
Admission: EM | Admit: 2020-11-05 | Discharge: 2020-11-05 | Disposition: A | Discharge status: Home / Self Care | Payer: BC Managed Care – PPO | Attending: Physician Assistant | Admitting: Physician Assistant

## 2020-11-05 DIAGNOSIS — L03116 Cellulitis of left lower limb: Secondary | ICD-10-CM | POA: Diagnosis not present

## 2020-11-05 DIAGNOSIS — R21 Rash and other nonspecific skin eruption: Secondary | ICD-10-CM

## 2020-11-05 DIAGNOSIS — L235 Allergic contact dermatitis due to other chemical products: Secondary | ICD-10-CM | POA: Diagnosis not present

## 2020-11-05 MED ORDER — KETOROLAC TROMETHAMINE 30 MG/ML IJ SOLN
30.0000 mg | Freq: Once | INTRAMUSCULAR | Status: AC
  Administered 2020-11-05: 30 mg via INTRAMUSCULAR

## 2020-11-05 MED ORDER — CEPHALEXIN 500 MG PO CAPS: 500.0000 mg | ORAL_CAPSULE | Freq: Three times a day (TID) | ORAL | 0 refills | Status: DC

## 2020-11-05 MED ORDER — FLUCONAZOLE 150 MG PO TABS: 150.0000 mg | ORAL_TABLET | Freq: Once | ORAL | 0 refills | Status: AC

## 2020-11-05 MED ORDER — MUPIROCIN 2 % EX OINT: 1.0000 "application " | TOPICAL_OINTMENT | Freq: Every day | CUTANEOUS | 0 refills | Status: DC

## 2020-11-05 MED ORDER — KETOROLAC TROMETHAMINE 30 MG/ML IJ SOLN
INTRAMUSCULAR | Status: AC
  Filled 2020-11-05: qty 1

## 2020-11-05 NOTE — Discharge Instructions (Signed)
Avoid repeated use of topical antibiotic as this likely caused an allergic reaction.  Use Bactroban as needed with dressing changes.  Keep area clean with soap and water but avoid putting anything like peroxide.  Start Keflex 3 times a day for 10 days.  This can cause yeast infection so I sent in 1 dose of Diflucan you can use as needed.  If the area of redness continues to spread despite use of this medication please return for reevaluation.  If you develop any worsening symptoms including fever, nausea, vomiting, increased pain you need to be reevaluated.  We gave you a shot of Toradol which is like ibuprofen so please do not take NSAIDs including aspirin, ibuprofen/Advil, naproxen/Aleve for 24 hours.  You can use Tylenol for breakthrough pain.

## 2020-11-05 NOTE — ED Provider Notes (Signed)
[facility]    CSN: [id number] Arrival date & time: 11/05/20  1007      History   Chief Complaint Chief Complaint  Patient presents with   Rash    HPI Natasha Garcia is a 30 y.o. female.   Patient presents today with a 1 week history of rash that has been worsening in her left popliteal fossa.  Reports that she had a rope burn from a dog leash in this area that has gradually been worsening.  She applied topical antibiotic that caused worsening of symptoms as well as spread of irritating rash.  She has since discontinued use of this medication has been cleaning it with soap and water but continues to have some drainage and spread of erythema prompting evaluation.  She reports pain is rated 9 on a 0-10 pain scale, localized to affected area, described as aching, no alleviating factors identified.  She has been alternating Tylenol and ibuprofen without improvement of symptoms.  Denies any fever, nausea, vomiting, dizziness, syncope.   Past Medical History:  Diagnosis Date   Anxiety    Anxiety and depression 01/20/2019   Hx of herpes genitalis    No pertinent past medical history     Patient Active Problem List   Diagnosis Date Noted   Lumbar back pain 02/24/2020   Annual physical exam 11/13/2019   Mild depression (HCC) 11/13/2019   Screening examination for STD (sexually transmitted disease) 11/13/2019   Test anxiety 06/25/2019   Passive suicidal ideations 01/20/2019   Has access to firearm 01/20/2019   Alcohol use 01/20/2019   Bacterial vaginosis 04/12/2018   Abnormal uterine bleeding 03/28/2018   Yeast infection 07/22/2017   Mood disorder (HCC) 01/11/2017   Contraception management 11/19/2016   Folliculitis 11/19/2016   Rash and nonspecific skin eruption 08/28/2016   Vaginal candidiasis 03/14/2016   Trichomonal vaginitis 02/03/2016   Tetrahydrocannabinol (THC) use disorder, moderate, dependence (HCC) 01/27/2016   Alopecia areata 01/06/2016   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   Episodic mood disorder (HCC) 05/10/2014   Cervical high risk HPV (human papillomavirus) test positive 04/21/2014   Possible exposure to STD 04/19/2014   Screening for malignant neoplasm of cervix 04/19/2014   Paroxysmal supraventricular tachycardia (HCC) 01/18/2014   Pityriasis 01/01/2013   Bacterial vaginitis 09/13/2010   SCOLIOSIS, MILD 06/06/2007    Past Surgical History:  Procedure Laterality Date   NO PAST SURGERIES      OB History     Gravida  1   Para  1   Term  1   Preterm      AB      Living  1      SAB      IAB      Ectopic      Multiple      Live Births               Home Medications    Prior to Admission medications   Medication Sig Start Date End Date Taking? Authorizing Provider  cephALEXin (KEFLEX) 500 MG capsule Take 1 capsule (500 mg total) by mouth 3 (three) times daily. 11/05/20  Yes Devory Mckinzie K, PA-C  fluconazole (DIFLUCAN) 150 MG tablet Take 1 tablet (150 mg total) by mouth once for 1 dose. 11/05/20 11/05/20 Yes Greta Yung, Noberto Retort, PA-C  mupirocin ointment (BACTROBAN) 2 % Apply 1 application topically daily. 11/05/20  Yes Yanelli Zapanta K, PA-C  buPROPion (WELLBUTRIN XL) 150 MG 24 hr tablet Take 1  tablet (150 mg total) by mouth daily. 01/20/19   Garnette Gunner, MD  valACYclovir (VALTREX) 1000 MG tablet TAKE 1 TABLET BY MOUTH DAILY. 08/17/20   Latrelle Dodrill, MD    Family History Family History  Problem Relation Age of Onset   Cancer Maternal Grandmother    Diabetes Neg Hx    Heart disease Neg Hx    Stroke Neg Hx    Anesthesia problems Neg Hx     Social History Social History   Tobacco Use   Smoking status: Never   Smokeless tobacco: Never  Vaping Use   Vaping Use: Never used  Substance Use Topics   Alcohol use: Yes    Comment: occasiona   Drug use: Yes    Types: Marijuana     Allergies   Patient has no known allergies.   Review of Systems Review of Systems   Constitutional:  Positive for activity change. Negative for appetite change, fatigue and fever.  Respiratory:  Negative for cough and shortness of breath.   Cardiovascular:  Negative for chest pain.  Gastrointestinal:  Negative for abdominal pain, diarrhea, nausea and vomiting.  Skin:  Positive for color change and rash. Negative for wound.  Neurological:  Negative for dizziness, light-headedness and headaches.    Physical Exam Triage Vital Signs ED Triage Vitals  Enc Vitals Group     BP 11/05/20 1045 115/73     Pulse Rate 11/05/20 1045 81     Resp 11/05/20 1045 16     Temp 11/05/20 1045 98.5 F (36.9 C)     Temp Source 11/05/20 1045 Oral     SpO2 11/05/20 1045 100 %     Weight --      Height --      Head Circumference --      Peak Flow --      Pain Score 11/05/20 1044 9     Pain Loc --      Pain Edu? --      Excl. in GC? --    No data found.  Updated Vital Signs BP 115/73 (BP Location: Left Arm)   Pulse 81   Temp 98.5 F (36.9 C) (Oral)   Resp 16   SpO2 100%   Visual Acuity Right Eye Distance:   Left Eye Distance:   Bilateral Distance:    Right Eye Near:   Left Eye Near:    Bilateral Near:     Physical Exam Vitals reviewed.  Constitutional:      General: She is awake. She is not in acute distress.    Appearance: Normal appearance. She is normal weight. She is not ill-appearing.     Comments: Appears stated age no acute distress sitting comfortably in exam room  HENT:     Head: Normocephalic and atraumatic.  Cardiovascular:     Rate and Rhythm: Normal rate and regular rhythm.     Heart sounds: Normal heart sounds, S1 normal and S2 normal. No murmur heard. Pulmonary:     Effort: Pulmonary effort is normal.     Breath sounds: Normal breath sounds. No wheezing, rhonchi or rales.     Comments: Clear to auscultation bilaterally Skin:    Findings: Rash present. Rash is macular and papular.          Comments: 6 cm x 5 cm area of erythema with central  crusting and scant purulent drainage.  Surrounding induration with fine vesicles.  Area is tender to touch and warm to touch.  No streaking or evidence of lymphangitis.  Psychiatric:        Behavior: Behavior is cooperative.     UC Treatments / Results  Labs (all labs ordered are listed, but only abnormal results are displayed) Labs Reviewed - No data to display  EKG   Radiology No results found.  Procedures Procedures (including critical care time)  Medications Ordered in UC Medications  ketorolac (TORADOL) 30 MG/ML injection 30 mg (has no administration in time range)    Initial Impression / Assessment and Plan / UC Course  I have reviewed the triage vital signs and the nursing notes.  Pertinent labs & imaging results that were available during my care of the patient were reviewed by me and considered in my medical decision making (see chart for details).      Area appears to be combination of cellulitis and contact dermatitis related to topical antibiotic use.  Patient was instructed not to use this over-the-counter topical antibiotic and instead switch to Bactroban.  She was prescribed Keflex 3 times a day for 10 days.  She has no concern for pregnancy.  She reports history of yeast infections with antibiotic use and so was given 1 dose of Diflucan.  Recommended she alternate over-the-counter medications for symptom relief.  She was given a dose of Toradol 30 mg in clinic to help with pain and inflammation.  She was instructed not to take additional NSAIDs for 24 hours.  Recommended she use cool compresses and keep area clean and dry.  Discussed alarm symptoms that warrant emergent evaluation.  Strict return precautions given to which patient expressed understanding.  Final Clinical Impressions(s) / UC Diagnoses   Final diagnoses:  Cellulitis of left lower extremity  Rash  Allergic dermatitis due to other chemical product     Discharge Instructions      Avoid  repeated use of topical antibiotic as this likely caused an allergic reaction.  Use Bactroban as needed with dressing changes.  Keep area clean with soap and water but avoid putting anything like peroxide.  Start Keflex 3 times a day for 10 days.  This can cause yeast infection so I sent in 1 dose of Diflucan you can use as needed.  If the area of redness continues to spread despite use of this medication please return for reevaluation.  If you develop any worsening symptoms including fever, nausea, vomiting, increased pain you need to be reevaluated.  We gave you a shot of Toradol which is like ibuprofen so please do not take NSAIDs including aspirin, ibuprofen/Advil, naproxen/Aleve for 24 hours.  You can use Tylenol for breakthrough pain.     ED Prescriptions     Medication Sig Dispense Auth. Provider   cephALEXin (KEFLEX) 500 MG capsule Take 1 capsule (500 mg total) by mouth 3 (three) times daily. 30 capsule Jona Erkkila K, PA-C   fluconazole (DIFLUCAN) 150 MG tablet Take 1 tablet (150 mg total) by mouth once for 1 dose. 1 tablet Jennier Schissler K, PA-C   mupirocin ointment (BACTROBAN) 2 % Apply 1 application topically daily. 22 g Davionne Dowty K, PA-C      PDMP not reviewed this encounter.   Jeani Hawking, PA-C 11/05/20 1113

## 2020-11-05 NOTE — ED Triage Notes (Signed)
Pt reports rash in the left leg x 1 week. States the rash started after friction with a dog leash. Pt states the are is infected.

## 2020-11-18 ENCOUNTER — Other Ambulatory Visit (HOSPITAL_COMMUNITY)
Admission: RE | Admit: 2020-11-18 | Discharge: 2020-11-18 | Disposition: A | Discharge status: Home / Self Care | Payer: BC Managed Care – PPO | Source: Ambulatory Visit | Attending: Family Medicine | Admitting: Family Medicine

## 2020-11-18 ENCOUNTER — Other Ambulatory Visit: Payer: Self-pay

## 2020-11-18 ENCOUNTER — Encounter: Payer: Self-pay | Admitting: Family Medicine

## 2020-11-18 ENCOUNTER — Ambulatory Visit (INDEPENDENT_AMBULATORY_CARE_PROVIDER_SITE_OTHER): Payer: BC Managed Care – PPO | Admitting: Family Medicine

## 2020-11-18 VITALS — BP 123/80 | HR 89 | Wt 144.0 lb

## 2020-11-18 DIAGNOSIS — N898 Other specified noninflammatory disorders of vagina: Secondary | ICD-10-CM | POA: Insufficient documentation

## 2020-11-18 DIAGNOSIS — Z113 Encounter for screening for infections with a predominantly sexual mode of transmission: Secondary | ICD-10-CM | POA: Diagnosis not present

## 2020-11-18 LAB — POCT WET PREP (WET MOUNT)
Clue Cells Wet Prep Whiff POC: NEGATIVE
Trichomonas Wet Prep HPF POC: ABSENT

## 2020-11-18 LAB — POCT URINE PREGNANCY: Preg Test, Ur: NEGATIVE

## 2020-11-18 NOTE — Patient Instructions (Addendum)
It was wonderful to see you today.  Please bring ALL of your medications with you to every visit.   Today we talked about:   - Taking a prenatal vitamin every day  - Your pregnancy test is negative  - Your flu shot is due in the fall  - There were no yeast and no bacteria suggestive of bacterial vaginosis on your wet prep--I recommend we wait for the rest of the results    Thank you for choosing [facility].   Please call [phone] with any questions about today's appointment.  Please be sure to schedule follow up at the front  desk before you leave today.   Terisa Starr, MD  Family Medicine

## 2020-11-18 NOTE — Progress Notes (Signed)
    SUBJECTIVE:   CHIEF COMPLAINT: discharge  HPI:   Natasha Garcia is a 30 y.o. yo with history notable for HSV2 presenting for vaginal discharge. She recently received cephalexin and then took fluconazole 2 days ago. Reports she has an HSV flare currently on her perineal area. Has 3 sexual partners in past six months. Uses condoms inconsistently. She does not desire pregnancy. She does not use contraception. She is not taking a prenatal vitamin. She is up to date on her Pap.   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   PERTINENT  PMH / PSH/Family/Social History :Updated and reviewed  OBJECTIVE:   BP 123/80   Pulse 89   Wt 144 lb (65.3 kg)   SpO2 100%   BMI 22.55 kg/m   Today's weight:  Last Weight  Most recent update: 11/18/2020 10:22 AM    Weight  65.3 kg (144 lb)            Review of prior weights: Filed Weights   11/18/20 1022  Weight: 144 lb (65.3 kg)    GU Exam:  Chaperoned exam.  External exam: Normal-appearing female external genitalia.  Vaginal exam notable for slightly thickened discharge, + small ulcer with clean base, sensitive along patient's L introitus.  Cervix without discharge or obvious lesion.      ASSESSMENT/PLAN:   Vaginal Discharge, possibly clearing yeast infection. Wet prep negative. Will follow up on GC/CT. Declined HIV/RPR/Hep B. Discussed contraception. Recommended prenatal vitamin. Suspect some of the pain she may be having is related to HSV.   HCM UTD on vaccines.     Terisa Starr, MD  Family Medicine Teaching Service  [facility]

## 2020-11-21 LAB — CERVICOVAGINAL ANCILLARY ONLY
Chlamydia: NEGATIVE
Comment: NEGATIVE
Comment: NEGATIVE
Comment: NORMAL
Neisseria Gonorrhea: NEGATIVE
Trichomonas: NEGATIVE

## 2020-12-05 DIAGNOSIS — H5213 Myopia, bilateral: Secondary | ICD-10-CM | POA: Diagnosis not present

## 2021-03-08 ENCOUNTER — Ambulatory Visit: Payer: BC Managed Care – PPO | Admitting: Family Medicine

## 2021-07-25 ENCOUNTER — Other Ambulatory Visit (HOSPITAL_COMMUNITY)
Admission: RE | Admit: 2021-07-25 | Discharge: 2021-07-25 | Disposition: A | Discharge status: Home / Self Care | Payer: BC Managed Care – PPO | Source: Ambulatory Visit | Attending: Family Medicine | Admitting: Family Medicine

## 2021-07-25 ENCOUNTER — Encounter: Payer: Self-pay | Admitting: Family Medicine

## 2021-07-25 ENCOUNTER — Ambulatory Visit (INDEPENDENT_AMBULATORY_CARE_PROVIDER_SITE_OTHER): Payer: BC Managed Care – PPO | Admitting: Family Medicine

## 2021-07-25 VITALS — BP 106/70 | HR 69 | Wt 136.8 lb

## 2021-07-25 DIAGNOSIS — Z113 Encounter for screening for infections with a predominantly sexual mode of transmission: Secondary | ICD-10-CM

## 2021-07-25 DIAGNOSIS — B009 Herpesviral infection, unspecified: Secondary | ICD-10-CM

## 2021-07-25 LAB — POCT WET PREP (WET MOUNT)
Clue Cells Wet Prep Whiff POC: POSITIVE
Trichomonas Wet Prep HPF POC: ABSENT

## 2021-07-25 MED ORDER — VALACYCLOVIR HCL 1 G PO TABS: 1000.0000 mg | ORAL_TABLET | Freq: Every day | ORAL | 2 refills | Status: DC

## 2021-07-25 MED ORDER — METRONIDAZOLE 500 MG PO TABS: 500.0000 mg | ORAL_TABLET | Freq: Two times a day (BID) | ORAL | 0 refills | Status: DC

## 2021-07-25 MED ORDER — PRENATAL 27-0.8 MG PO TABS: 1.0000 | ORAL_TABLET | Freq: Every day | ORAL | 0 refills | Status: DC

## 2021-07-25 NOTE — Patient Instructions (Addendum)
It was great to see you again today! ? ?Testing for infections today ?Refilled valtrex for you ?Take prenatal vitamin daily ? ?Be well, ?Dr. Pollie Meyer  ? ?Safe Sex ?Practicing safe sex means taking steps before and during sex to reduce your risk of: ?Getting an STI (sexually transmitted infection). ?Giving your partner an STI. ?Unwanted or unplanned pregnancy. ?How to practice safe sex ?Ways you can practice safe sex ? ?Limit your sexual partners to only one partner who is having sex with only you. ?Avoid using alcohol and drugs before having sex. Alcohol and drugs can affect your judgment. ?Before having sex with a new partner: ?Talk to your partner about past partners, past STIs, and drug use. ?Get screened for STIs and discuss the results with your partner. Ask your partner to get screened too. ?Check your body regularly for sores, blisters, rashes, or unusual discharge. If you notice any of these problems, visit your health care provider. ?Avoid sexual contact if you have symptoms of an infection or you are being treated for an STI. ?While having sex, use a condom. Make sure to: ?Use a condom every time you have vaginal, oral, or anal sex. Both females and males should wear condoms during oral sex. ?Keep condoms in place from the beginning to the end of sexual activity. ?Use a latex condom, if possible. Latex condoms offer the best protection. ?Use only water-based lubricants with a condom. Using petroleum-based lubricants or oils will weaken the condom and increase the chance that it will break. ?Ways your health care provider can help you practice safe sex ? ?See your health care provider for regular screenings, exams, and tests for STIs. ?Talk with your health care provider about what kind of birth control (contraception) is best for you. ?Get vaccinated against hepatitis B and human papillomavirus (HPV). ?If you are at risk of being infected with HIV (human immunodeficiency virus), talk with your health care  provider about taking a prescription medicine to prevent HIV infection. You are at risk for HIV if you: ?Are a man who has sex with other men. ?Are sexually active with more than one partner. ?Take drugs by injection. ?Have a sex partner who has HIV. ?Have unprotected sex. ?Have sex with someone who has sex with both men and women. ?Have had an STI. ?Follow these instructions at home: ?Take over-the-counter and prescription medicines only as told by your health care provider. ?Keep all follow-up visits. This is important. ?Where to find more information ?Centers for Disease Control and Prevention: FootballExhibition.com.br ?Planned Parenthood: www.plannedparenthood.org ?Office on Women's Health: http://hoffman.com/ ?Summary ?Practicing safe sex means taking steps before and during sex to reduce your risk getting an STI, giving your partner an STI, and having an unwanted or unplanned pregnancy. ?Before having sex with a new partner, talk to your partner about past partners, past STIs, and drug use. ?Use a condom every time you have vaginal, oral, or anal sex. Both females and males should wear condoms during oral sex. ?Check your body regularly for sores, blisters, rashes, or unusual discharge. If you notice any of these problems, visit your health care provider. ?See your health care provider for regular screenings, exams, and tests for STIs. ?This information is not intended to replace advice given to you by your health care provider. Make sure you discuss any questions you have with your health care provider. ?Document Revised: 08/31/2019 Document Reviewed: 08/31/2019 ?Elsevier Patient Education ? 2023 Elsevier Inc. ? ?

## 2021-07-25 NOTE — Progress Notes (Signed)
?  Date of Visit: 07/25/2021  ? ?SUBJECTIVE:  ? ?HPI: ? ?Natasha Garcia is a 31 yo female who presents today for STI screening due to vaginal discomfort. ? ?Natasha Garcia reports vaginal discomfort that began several days ago and feels similar to discomfort she felt with prior BV infection. She reports no change in odor, amount, consistency, or color of discharge. She endorses having had unprotected sex with one partner, who she knows is sexually active with other partners. She denies vaginal bleeding aside from intermittent post-coital spotting, which doesn't happen every time she is sexually active, but about 20% of the time with intercourse for the last year. Notes that partner's penis is large, may account for the intermittent bleeding. ?  ?She does not use contraception and does not desire to start any form of contraception. Is open to the idea of pregnancy. She does not use condoms. She is not taking a prenatal vitamin. Her LMP began 2 weeks ago and she is up to date on her pap.  ? 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 Takes valtrex just as needed during those episodes. ? ?She declines the COVID vaccine today. ? ?OBJECTIVE:  ? ?BP 106/70   Pulse 69   Wt 136 lb 12.8 oz (62.1 kg)   LMP 07/11/2021 (Approximate)   SpO2 99%   BMI 21.43 kg/m?  ?Gen: Well-appearing ?HEENT: NCAT ?Lungs: Normal breathing effort ?Neuro: Alert, speech normal ? ?GU Exam: ?External exam: Normal-appearing female external genitalia. Vaginal exam notable for white discharge, cervical ectropion. No pain with bimanual exam, no adnexal masses. Chaperoned by CMA Natasha Garcia. ? ?ASSESSMENT/PLAN:  ? ?Health maintenance:  ?- COVID vaccine declined, counseling given ? ?Screening examination for STD (sexually transmitted disease) ?Discussed contraception, patient declined all forms. Encouraged prenatal vitamin given unprotected sex and desire for pregnancy. ?Samples for wet prep, gc/chlamydia/trich obtained. HIV and RPR tests  performed.  ? ?Discussed PrEP, patient declined today. ? ?Wet prep positive for BV, prescribed flagyl 500 mg bid x 7 days ? ?HSV-2 infection ?Patient counseled to take 1g valtrex for 5 days when the outbreak begins.  ? ?FOLLOW UP: ?Follow up as needed. ? ?Natasha Garcia, Medical Student ?[facility] ? ? ?Patient seen along with MS[employer] student Natasha Garcia. I personally evaluated this patient along with the student, and verified all aspects of the history, physical exam, and medical decision making as documented by the student. I agree with the student's documentation and have made all necessary edits. ? ?Levert Feinstein, MD  ?[facility]   ? ?

## 2021-07-25 NOTE — Assessment & Plan Note (Addendum)
Discussed contraception, patient declined all forms. Encouraged prenatal vitamin given unprotected sex and desire for pregnancy. ?Samples for wet prep, gc/chlamydia/trich obtained. HIV and RPR tests performed.  ? ?Discussed PrEP, patient declined today. ? ?Wet prep positive for BV, prescribed flagyl 500 mg bid x 7 days ?

## 2021-07-25 NOTE — Assessment & Plan Note (Signed)
Patient counseled to take 1g valtrex for 5 days when the outbreak begins.  ?

## 2021-07-26 LAB — CERVICOVAGINAL ANCILLARY ONLY
Chlamydia: NEGATIVE
Chlamydia: NEGATIVE
Comment: NEGATIVE
Comment: NEGATIVE
Comment: NEGATIVE
Comment: NEGATIVE
Comment: NORMAL
Comment: NORMAL
Neisseria Gonorrhea: NEGATIVE
Neisseria Gonorrhea: NEGATIVE
Trichomonas: NEGATIVE
Trichomonas: NEGATIVE

## 2021-07-26 LAB — RPR: RPR Ser Ql: NONREACTIVE

## 2021-07-26 LAB — HIV ANTIBODY (ROUTINE TESTING W REFLEX): HIV Screen 4th Generation wRfx: NONREACTIVE

## 2021-09-12 ENCOUNTER — Encounter: Payer: Self-pay | Admitting: *Deleted

## 2021-12-03 ENCOUNTER — Other Ambulatory Visit: Payer: Self-pay | Admitting: Family Medicine

## 2022-01-05 ENCOUNTER — Encounter (HOSPITAL_COMMUNITY): Payer: Self-pay

## 2022-01-05 ENCOUNTER — Other Ambulatory Visit: Payer: Self-pay

## 2022-01-05 ENCOUNTER — Ambulatory Visit (HOSPITAL_COMMUNITY)
Admission: EM | Admit: 2022-01-05 | Discharge: 2022-01-05 | Disposition: A | Discharge status: Home / Self Care | Payer: Medicaid Other | Attending: Emergency Medicine | Admitting: Emergency Medicine

## 2022-01-05 DIAGNOSIS — R051 Acute cough: Secondary | ICD-10-CM | POA: Insufficient documentation

## 2022-01-05 DIAGNOSIS — Z792 Long term (current) use of antibiotics: Secondary | ICD-10-CM | POA: Insufficient documentation

## 2022-01-05 DIAGNOSIS — Z79899 Other long term (current) drug therapy: Secondary | ICD-10-CM | POA: Insufficient documentation

## 2022-01-05 DIAGNOSIS — U071 COVID-19: Secondary | ICD-10-CM | POA: Insufficient documentation

## 2022-01-05 DIAGNOSIS — R6889 Other general symptoms and signs: Secondary | ICD-10-CM | POA: Diagnosis not present

## 2022-01-05 DIAGNOSIS — Z7952 Long term (current) use of systemic steroids: Secondary | ICD-10-CM | POA: Diagnosis not present

## 2022-01-05 DIAGNOSIS — R6883 Chills (without fever): Secondary | ICD-10-CM | POA: Insufficient documentation

## 2022-01-05 DIAGNOSIS — H6690 Otitis media, unspecified, unspecified ear: Secondary | ICD-10-CM | POA: Insufficient documentation

## 2022-01-05 DIAGNOSIS — H6693 Otitis media, unspecified, bilateral: Secondary | ICD-10-CM | POA: Diagnosis not present

## 2022-01-05 LAB — RESP PANEL BY RT-PCR (FLU A&B, COVID) ARPGX2
Influenza A by PCR: NEGATIVE
Influenza B by PCR: NEGATIVE
SARS Coronavirus 2 by RT PCR: POSITIVE — AB

## 2022-01-05 MED ORDER — AMOXICILLIN-POT CLAVULANATE 875-125 MG PO TABS: 1.0000 | ORAL_TABLET | Freq: Two times a day (BID) | ORAL | 0 refills | Status: DC

## 2022-01-05 MED ORDER — METHYLPREDNISOLONE SODIUM SUCC 125 MG IJ SOLR
125.0000 mg | Freq: Once | INTRAMUSCULAR | Status: AC
  Administered 2022-01-05: 125 mg via INTRAMUSCULAR

## 2022-01-05 MED ORDER — METHYLPREDNISOLONE SODIUM SUCC 125 MG IJ SOLR
INTRAMUSCULAR | Status: AC
  Filled 2022-01-05: qty 2

## 2022-01-05 MED ORDER — TRAMADOL HCL 50 MG PO TABS: 50.0000 mg | ORAL_TABLET | Freq: Four times a day (QID) | ORAL | 0 refills | Status: DC | PRN

## 2022-01-05 MED ORDER — PREDNISONE 10 MG PO TABS: 20.0000 mg | ORAL_TABLET | Freq: Every day | ORAL | 0 refills | Status: DC

## 2022-01-05 MED ORDER — KETOROLAC TROMETHAMINE 60 MG/2ML IM SOLN
INTRAMUSCULAR | Status: AC
  Filled 2022-01-05: qty 2

## 2022-01-05 MED ORDER — KETOROLAC TROMETHAMINE 60 MG/2ML IM SOLN
60.0000 mg | Freq: Once | INTRAMUSCULAR | Status: AC
  Administered 2022-01-05: 60 mg via INTRAMUSCULAR

## 2022-01-05 NOTE — ED Triage Notes (Signed)
Patient wanting to be tested for Covid and flu. Patient having body aches, ear pain, chest pain, and head aches. Onset 2-3 days ago.   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

## 2022-01-05 NOTE — ED Provider Notes (Signed)
[facility]    CSN: [id number] Arrival date & time: 01/05/22  0947      History   Chief Complaint Chief Complaint  Patient presents with   Headache   Chills   URI    HPI Natasha Garcia is a 31 y.o. female.   Patient presents today with cough congestion and chills fever bilateral ear pain and sore throat x 3 days .  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  She would like to be tested for COVID and flu.  Patient has taken over-the-counter medicines and Tylenol with no relief.  Given fluids but not able to eat much.    Past Medical History:  Diagnosis Date   Anxiety    Anxiety and depression 01/20/2019   Hx of herpes genitalis    No pertinent past medical history     Patient Active Problem List   Diagnosis Date Noted   Screening examination for STD (sexually transmitted disease) 11/13/2019   Passive suicidal ideations 01/20/2019   Has access to firearm 01/20/2019   Alcohol use 01/20/2019   Abnormal uterine bleeding 03/28/2018   Mood disorder (HCC) 01/11/2017   Tetrahydrocannabinol (THC) use disorder, moderate, dependence (HCC) 01/27/2016   HSV-2 infection 08/26/2015   Paroxysmal supraventricular tachycardia (HCC) 01/18/2014   SCOLIOSIS, MILD 06/06/2007    Past Surgical History:  Procedure Laterality Date   NO PAST SURGERIES      OB History     Gravida  1   Para  1   Term  1   Preterm      AB      Living  1      SAB      IAB      Ectopic      Multiple      Live Births               Home Medications    Prior to Admission medications   Medication Sig Start Date End Date Taking? Authorizing Provider  amoxicillin-clavulanate (AUGMENTIN) 875-125 MG tablet Take 1 tablet by mouth every 12 (twelve) hours. 01/05/22  Yes Coralyn Mark, NP  predniSONE (DELTASONE) 10 MG tablet Take 2 tablets (20 mg total) by mouth daily. 01/05/22   Yes Coralyn Mark, NP  traMADol (ULTRAM) 50 MG tablet Take 1 tablet (50 mg total) by mouth every 6 (six) hours as needed. 01/05/22  Yes Coralyn Mark, NP  Prenatal Vit-Fe Fumarate-FA (MULTIVITAMIN-PRENATAL) 27-0.8 MG TABS tablet Take 1 tablet by mouth daily at 12 noon. 07/25/21   Latrelle Dodrill, MD  valACYclovir (VALTREX) 1000 MG tablet TAKE 1 TABLET (1,000 MG TOTAL) BY MOUTH DAILY. FOR 5 DAYS AT START OF OUTBREAK 12/04/21   Latrelle Dodrill, MD    Family History Family History  Problem Relation Age of Onset   Cancer Maternal Grandmother    Diabetes Neg Hx    Heart disease Neg Hx    Stroke Neg Hx    Anesthesia problems Neg Hx     Social History Social History   Tobacco Use   Smoking status: Never   Smokeless tobacco: Never  Vaping Use   Vaping Use: Never used  Substance Use Topics   Alcohol use: Yes    Comment: occasiona   Drug use: Yes    Types: Marijuana     Allergies   Patient has no known  allergies.   Review of Systems Review of Systems  Constitutional:  Positive for activity change, chills, fatigue and fever.  HENT:  Positive for congestion, ear pain, postnasal drip, rhinorrhea, sinus pressure, sinus pain, sneezing and sore throat.   Eyes: Negative.   Respiratory:  Positive for cough. Negative for shortness of breath and wheezing.   Cardiovascular: Negative.   Gastrointestinal: Negative.   Genitourinary: Negative.   Musculoskeletal:        Generalized body aches  Skin: Negative.   Neurological: Negative.      Physical Exam Triage Vital Signs ED Triage Vitals  Enc Vitals Group     BP 01/05/22 1035 112/85     Pulse Rate 01/05/22 1035 (!) 101     Resp 01/05/22 1035 16     Temp 01/05/22 1035 98.2 F (36.8 C)     Temp Source 01/05/22 1035 Oral     SpO2 --      Weight --      Height --      Head Circumference --      Peak Flow --      Pain Score 01/05/22 1040 10     Pain Loc --      Pain Edu? --      Excl. in [place]? --    No data  found.  Updated Vital Signs BP 112/85 (BP Location: Right Arm)   Pulse (!) 101   Temp 98.2 F (36.8 C) (Oral)   Resp 16   LMP 12/08/2021 (Approximate)   Visual Acuity Right Eye Distance:   Left Eye Distance:   Bilateral Distance:    Right Eye Near:   Left Eye Near:    Bilateral Near:     Physical Exam Constitutional:      Appearance: She is ill-appearing.  HENT:     Right Ear: Tenderness present. A middle ear effusion is present. Tympanic membrane is erythematous and bulging.     Left Ear: Tenderness present. A middle ear effusion is present. Tympanic membrane is erythematous and bulging.     Mouth/Throat:     Mouth: Mucous membranes are moist.     Pharynx: Uvula midline. Pharyngeal swelling present.     Tonsils: No tonsillar exudate. 1+ on the right. 1+ on the left.  Cardiovascular:     Rate and Rhythm: Tachycardia present.  Pulmonary:     Effort: Pulmonary effort is normal.     Breath sounds: Normal breath sounds.  Abdominal:     Palpations: Abdomen is soft.  Musculoskeletal:        General: Normal range of motion.     Cervical back: Normal range of motion.  Skin:    Comments: Hot to touch  Neurological:     Mental Status: She is alert.     GCS: GCS eye subscore is 4. GCS verbal subscore is 5. GCS motor subscore is 6.      UC Treatments / Results  Labs (all labs ordered are listed, but only abnormal results are displayed) Labs Reviewed  RESP PANEL BY RT-PCR (FLU A&B, COVID) [id number]    EKG   Radiology No results found.  Procedures Procedures (including critical care time)  Medications Ordered in UC Medications  methylPREDNISolone sodium succinate (SOLU-MEDROL) 125 mg/2 mL injection 125 mg (125 mg Intramuscular Given 01/05/22 1058)  ketorolac (TORADOL) injection 60 mg (60 mg Intramuscular Given 01/05/22 1059)    Initial Impression / Assessment and Plan / UC Course  I have reviewed the triage vital  signs and the nursing notes.  Pertinent labs &  imaging results that were available during my care of the patient were reviewed by me and considered in my medical decision making (see chart for details).    Take pain medicine as needed We will send off your test for COVID and flu check your MyChart in 24 hours for results Take over-the-counter cough and cold medicines as needed Take Tylenol Motrin as needed for pain This is symptoms may persist 3 to 5 days if they become worse you need to be seen in the emergency room Stay hydrated drink plenty of fluids  Final Clinical Impressions(s) / UC Diagnoses   Final diagnoses:  Flu-like symptoms  Acute otitis media, unspecified otitis media type  Acute cough  Chills     Discharge Instructions      Take pain medicine as needed We will send off your test for COVID and flu check your MyChart in 24 hours for results Take over-the-counter cough and cold medicines as needed Take Tylenol Motrin as needed for pain This is symptoms may persist 3 to 5 days if they become worse you need to be seen in the emergency room Stay hydrated drink plenty of fluids    ED Prescriptions     Medication Sig Dispense Auth. Provider   predniSONE (DELTASONE) 10 MG tablet Take 2 tablets (20 mg total) by mouth daily. 15 tablet Maple Mirza L, NP   traMADol (ULTRAM) 50 MG tablet Take 1 tablet (50 mg total) by mouth every 6 (six) hours as needed. 5 tablet Maple Mirza L, NP   amoxicillin-clavulanate (AUGMENTIN) 875-125 MG tablet Take 1 tablet by mouth every 12 (twelve) hours. 14 tablet Coralyn Mark, NP      PDMP not reviewed this encounter.   Coralyn Mark, NP 01/05/22 1100

## 2022-01-05 NOTE — Discharge Instructions (Addendum)
Take pain medicine as needed We will send off your test for COVID and flu check your MyChart in 24 hours for results Take over-the-counter cough and cold medicines as needed Take Tylenol Motrin as needed for pain This is symptoms may persist 3 to 5 days if they become worse you need to be seen in the emergency room Stay hydrated drink plenty of fluids

## 2023-01-31 ENCOUNTER — Encounter (HOSPITAL_COMMUNITY): Payer: Self-pay

## 2023-01-31 ENCOUNTER — Ambulatory Visit (HOSPITAL_COMMUNITY)
Admission: EM | Admit: 2023-01-31 | Discharge: 2023-01-31 | Disposition: A | Discharge status: Home / Self Care | Payer: Medicaid Other | Attending: Emergency Medicine | Admitting: Emergency Medicine

## 2023-01-31 DIAGNOSIS — B349 Viral infection, unspecified: Secondary | ICD-10-CM | POA: Diagnosis present

## 2023-01-31 DIAGNOSIS — Z20822 Contact with and (suspected) exposure to covid-19: Secondary | ICD-10-CM | POA: Insufficient documentation

## 2023-01-31 LAB — POCT INFLUENZA A/B
Influenza A, POC: NEGATIVE
Influenza B, POC: NEGATIVE

## 2023-01-31 LAB — SARS CORONAVIRUS 2 (TAT 6-24 HRS): SARS Coronavirus 2: NEGATIVE

## 2023-01-31 MED ORDER — KETOROLAC TROMETHAMINE 30 MG/ML IJ SOLN
30.0000 mg | Freq: Once | INTRAMUSCULAR | Status: AC
  Administered 2023-01-31: 30 mg via INTRAMUSCULAR

## 2023-01-31 MED ORDER — FLUTICASONE PROPIONATE 50 MCG/ACT NA SUSP: 2.0000 | Freq: Every day | NASAL | 2 refills | Status: DC

## 2023-01-31 MED ORDER — CETIRIZINE HCL 10 MG PO TABS: 10.0000 mg | ORAL_TABLET | Freq: Every day | ORAL | 2 refills | Status: DC

## 2023-01-31 MED ORDER — KETOROLAC TROMETHAMINE 30 MG/ML IJ SOLN
INTRAMUSCULAR | Status: AC
  Filled 2023-01-31: qty 1

## 2023-01-31 NOTE — ED Triage Notes (Signed)
Patient c/o chills, body aches, headache, a productive cough with yellow sputum and a minor sore throat x 2 days.  Patient states she wasn't to be tested for flu and Covid because she works at an [employer].  Patient states she has been taking Robitussin.

## 2023-01-31 NOTE — Discharge Instructions (Signed)
The Toradol injection given today should start to work in about 30 minutes. Please do not use any NSAIDs (ibuprofen/Advil, naproxen/Aleve, etc) for the next 24 hours. You can safely use tylenol.   We will call you if your covid test returns positive.   Continue lots of fluids!  Robitussin for cough and congestion I recommend nasal spray like flonase

## 2023-01-31 NOTE — ED Provider Notes (Signed)
[facility]    CSN: [id number] Arrival date & time: 01/31/23  2725     History   Chief Complaint Chief Complaint  Patient presents with   Chills   Generalized Body Aches   Headache   Cough   Sore Throat    HPI Natasha Garcia is a 32 y.o. female.  2-day history of body aches, chills, headache and productive cough No known fever.  Denies shortness of breath or wheezing Has been taking Robitussin  Would like flu and COVID testing.  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 Has received toradol in the past for body aches that helped a lot. Requesting today. 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 Authorizing Provider  cetirizine (ZYRTEC ALLERGY) 10 MG tablet Take 1 tablet (10 mg total) by mouth daily. 01/31/23  Yes Monzerrath Mcburney, PA-C  fluticasone (FLONASE) 50 MCG/ACT nasal spray Place 2 sprays  into both nostrils daily. 01/31/23  Yes Tadd Holtmeyer, Lurena Joiner, PA-C  Prenatal Vit-Fe Fumarate-FA (MULTIVITAMIN-PRENATAL) 27-0.8 MG TABS tablet Take 1 tablet by mouth daily at 12 noon. 07/25/21   Latrelle Dodrill, MD  valACYclovir (VALTREX) 1000 MG tablet TAKE 1 TABLET (1,000 MG TOTAL) BY MOUTH DAILY. FOR 5 DAYS AT START OF OUTBREAK 12/04/21   Latrelle Dodrill, MD    Family History Family History  Problem Relation Age of Onset   Cancer Maternal Grandmother    Diabetes Neg Hx    Heart disease Neg Hx    Stroke Neg Hx    Anesthesia problems Neg Hx     Social History Social History   Tobacco Use   Smoking status: Never   Smokeless tobacco: Never  Vaping Use   Vaping status: Never Used  Substance Use Topics   Alcohol use: Yes    Comment: occasiona   Drug use: Yes    Types: Marijuana     Allergies   Patient has no known allergies.   Review of Systems Review of Systems Per HPI  Physical Exam Triage Vital Signs ED Triage Vitals  Encounter Vitals Group  BP 01/31/23 1049 111/78     Systolic BP Percentile --      Diastolic BP Percentile --      Pulse Rate 01/31/23 1049 81     Resp 01/31/23 1049 14     Temp 01/31/23 1049 97.9 F (36.6 C)     Temp Source 01/31/23 1049 Oral     SpO2 01/31/23 1049 97 %     Weight --      Height --      Head Circumference --      Peak Flow --      Pain Score 01/31/23 1051 8     Pain Loc --      Pain Education --      Exclude from Growth Chart --    No data found.  Updated Vital Signs BP 111/78 (BP Location: Right Arm)   Pulse 81   Temp 97.9 F (36.6 C) (Oral)   Resp 14   LMP 01/21/2023 (Approximate)   SpO2 97%   Physical Exam Vitals and nursing note reviewed.  Constitutional:      Appearance: She is not ill-appearing.  HENT:     Ears:     Comments: Clear fluid behind bilat TM    Nose: No congestion or rhinorrhea.     Mouth/Throat:     Mouth: Mucous membranes are moist.     Pharynx: Oropharynx is clear. No posterior  oropharyngeal erythema.  Eyes:     Conjunctiva/sclera: Conjunctivae normal.  Cardiovascular:     Rate and Rhythm: Normal rate and regular rhythm.     Pulses: Normal pulses.     Heart sounds: Normal heart sounds.  Pulmonary:     Effort: Pulmonary effort is normal.     Breath sounds: Normal breath sounds.  Musculoskeletal:     Cervical back: Normal range of motion.  Lymphadenopathy:     Cervical: No cervical adenopathy.  Skin:    General: Skin is warm and dry.  Neurological:     Mental Status: She is alert and oriented to person, place, and time.     UC Treatments / Results  Labs (all labs ordered are listed, but only abnormal results are displayed) Labs Reviewed  SARS CORONAVIRUS 2 (TAT 6-24 HRS)  POCT INFLUENZA A/B    EKG   Radiology No results found.  Procedures Procedures (including critical care time)  Medications Ordered in UC Medications  ketorolac (TORADOL) 30 MG/ML injection 30 mg (30 mg Intramuscular Given 01/31/23 1154)    Initial Impression / Assessment and Plan / UC Course  I have reviewed the triage vital signs and the nursing notes.  Pertinent labs & imaging results that were available during my care of the patient were reviewed by me and considered in my medical decision making (see chart for details).  Toradol given for aches per patient request Rapid flu A/B negative  COVID test pending. Advise continue symptomatic care for likely viral etiology Return if needed Patient agreeable to plan Work note provided   Final Clinical Impressions(s) / UC Diagnoses   Final diagnoses:  Viral illness  Encounter for laboratory testing for COVID-19 virus     Discharge Instructions      The Toradol injection given today should start to work in about 30 minutes. Please do not use any NSAIDs (ibuprofen/Advil, naproxen/Aleve, etc) for the next 24 hours. You can safely use tylenol.   We will call you if your covid test returns positive.   Continue lots  of fluids!  Robitussin for cough and congestion I recommend nasal spray like flonase     ED Prescriptions     Medication Sig Dispense Auth. Provider   fluticasone (FLONASE) 50 MCG/ACT nasal spray Place 2 sprays into both nostrils daily. 9.9 mL Palin Tristan, PA-C   cetirizine (ZYRTEC ALLERGY) 10 MG tablet Take 1 tablet (10 mg total) by mouth daily. 30 tablet Eola Waldrep, Lurena Joiner, PA-C      PDMP not reviewed this encounter.   Marlow Baars, [place] 01/31/23 3086

## 2023-02-03 ENCOUNTER — Encounter (HOSPITAL_COMMUNITY): Payer: Self-pay | Admitting: *Deleted

## 2023-02-03 ENCOUNTER — Other Ambulatory Visit: Payer: Self-pay

## 2023-02-03 ENCOUNTER — Ambulatory Visit (HOSPITAL_COMMUNITY)
Admission: EM | Admit: 2023-02-03 | Discharge: 2023-02-03 | Disposition: A | Discharge status: Home / Self Care | Payer: Medicaid Other | Attending: Emergency Medicine | Admitting: Emergency Medicine

## 2023-02-03 DIAGNOSIS — J209 Acute bronchitis, unspecified: Secondary | ICD-10-CM

## 2023-02-03 MED ORDER — VENTOLIN HFA 108 (90 BASE) MCG/ACT IN AERS: 1.0000 | INHALATION_SPRAY | RESPIRATORY_TRACT | 1 refills | Status: DC | PRN

## 2023-02-03 MED ORDER — PSEUDOEPHEDRINE HCL 60 MG PO TABS: 60.0000 mg | ORAL_TABLET | Freq: Three times a day (TID) | ORAL | 0 refills | Status: DC | PRN

## 2023-02-03 MED ORDER — GUAIFENESIN ER 600 MG PO TB12: 600.0000 mg | ORAL_TABLET | Freq: Two times a day (BID) | ORAL | 0 refills | Status: DC

## 2023-02-03 MED ORDER — IPRATROPIUM BROMIDE 0.03 % NA SOLN: 2.0000 | Freq: Two times a day (BID) | NASAL | 0 refills | Status: DC

## 2023-02-03 MED ORDER — PREDNISONE 10 MG PO TABS: ORAL_TABLET | ORAL | 0 refills | Status: DC

## 2023-02-03 MED ORDER — COMPACT SPACE CHAMBER DEVI: 0 refills | Status: DC

## 2023-02-03 NOTE — ED Triage Notes (Signed)
C/O nasal congestion and cough onset 6 days ago. Was seen in [facility] 01/31/23 for sxs -- tested neg for Covid and influenza. States since yesterday feels like she's having some dyspnea, esp when laying down. Also c/o body aches. No known fevers. Taking cetirizine and Flonase.

## 2023-02-03 NOTE — ED Provider Notes (Signed)
[facility]    CSN: [id number] Arrival date & time: 02/03/23  1235      History   Chief Complaint Chief Complaint  Patient presents with   Cough   Nasal Congestion    HPI Natasha Garcia is a 32 y.o. female.   Patient has been sick since the 22nd.  She was initially seen in clinic on 24 October and tested negative for COVID and flu.  She has had increased mucus production and is now having some shortness of breath with worsening cough.  Her sore throat has started to resolve but she has started now with sinus pressure and headaches.   Cough Cough characteristics:  Productive Sputum characteristics:  Clear and yellow Severity:  Moderate Onset quality:  Gradual Duration:  5 days Timing:  Intermittent Progression:  Worsening Chronicity:  New Smoker: no   Worsened by:  Lying down Associated symptoms: shortness of breath     Past Medical History:  Diagnosis Date   Anxiety    Anxiety and depression 01/20/2019   Hx of herpes genitalis    No pertinent past medical history     Patient Active Problem List   Diagnosis Date Noted   Screening examination for STD (sexually transmitted disease) 11/13/2019   Passive suicidal ideations 01/20/2019   Has access to firearm 01/20/2019   Alcohol use 01/20/2019   Abnormal uterine bleeding 03/28/2018   Mood disorder (HCC) 01/11/2017   Tetrahydrocannabinol (THC) use disorder, moderate, dependence (HCC) 01/27/2016   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  Authorizing Provider  albuterol (VENTOLIN HFA) 108 (90 Base) MCG/ACT inhaler Inhale 1-2 puffs into the lungs every 4 (four) hours as needed for wheezing or shortness of breath. 02/03/23  Yes Diondre Pulis, Linde Gillis, NP  cetirizine (ZYRTEC ALLERGY) 10 MG tablet Take 1 tablet (10 mg total) by mouth daily. 01/31/23  Yes Rising, Rebecca, PA-C  fluticasone (FLONASE) 50 MCG/ACT nasal spray Place 2 sprays into both nostrils daily. 01/31/23  Yes Rising, Lurena Joiner, PA-C  guaiFENesin (MUCINEX) 600 MG 12 hr tablet Take 1 tablet (600 mg total) by mouth 2 (two) times daily. 02/03/23  Yes Madison Albea, Linde Gillis, NP  ipratropium (ATROVENT) 0.03 % nasal spray Place 2 sprays into both nostrils every 12 (twelve) hours. 02/03/23  Yes Thom Ollinger, Linde Gillis, NP  predniSONE (DELTASONE) 10 MG tablet 2 tabs for 4 days and then 1 tab for 2 days 02/03/23  Yes Diala Waxman, Linde Gillis, NP  pseudoephedrine (SUDAFED) 60 MG tablet Take 1 tablet (60 mg total) by mouth every 8 (eight) hours as needed for congestion. 02/03/23  Yes Nakeem Murnane, Linde Gillis, NP  Spacer/Aero-Holding Chambers (COMPACT SPACE CHAMBER) DEVI Use with inhaler 02/03/23  Yes Nyema Hachey, Linde Gillis, NP  Prenatal Vit-Fe Fumarate-FA Phylis Bougie)  27-0.8 MG TABS tablet Take 1 tablet by mouth daily at 12 noon. 07/25/21   Latrelle Dodrill, MD  valACYclovir (VALTREX) 1000 MG tablet TAKE 1 TABLET (1,000 MG TOTAL) BY MOUTH DAILY. FOR 5 DAYS AT START OF OUTBREAK 12/04/21   Latrelle Dodrill, MD    Family History Family History  Problem Relation Age of Onset   Cancer Maternal Grandmother    Diabetes Neg Hx    Heart disease Neg Hx    Stroke Neg Hx    Anesthesia problems Neg Hx     Social History Social History   Tobacco Use   Smoking status: Never   Smokeless tobacco: Never  Vaping Use   Vaping status: Never Used  Substance Use Topics   Alcohol use: Yes    Comment: occasionally   Drug use: Yes    Types: Marijuana     Allergies   Patient has no known allergies.   Review of  Systems Review of Systems  Constitutional:  Positive for appetite change.  HENT:  Positive for congestion, postnasal drip and sinus pressure.   Respiratory:  Positive for cough and shortness of breath.   Musculoskeletal:  Positive for arthralgias.     Physical Exam Triage Vital Signs ED Triage Vitals  Encounter Vitals Group     BP 02/03/23 1314 110/74     Systolic BP Percentile --      Diastolic BP Percentile --      Pulse Rate 02/03/23 1314 88     Resp 02/03/23 1314 16     Temp 02/03/23 1314 98.3 F (36.8 C)     Temp Source 02/03/23 1314 Oral     SpO2 02/03/23 1314 98 %     Weight --      Height --      Head Circumference --      Peak Flow --      Pain Score 02/03/23 1316 8     Pain Loc --      Pain Education --      Exclude from Growth Chart --    No data found.  Updated Vital Signs BP 110/74   Pulse 88   Temp 98.3 F (36.8 C) (Oral)   Resp 16   LMP 01/21/2023 (Approximate)   SpO2 98%   Visual Acuity Right Eye Distance:   Left Eye Distance:   Bilateral Distance:    Right Eye Near:   Left Eye Near:    Bilateral Near:     Physical Exam HENT:     Right Ear: Tympanic membrane normal.     Left Ear: Tympanic membrane normal.     Ears:     Comments: Bilateral serous fluid noted     Nose: Congestion and rhinorrhea present.     Mouth/Throat:     Mouth: Mucous membranes are moist.     Pharynx: Posterior oropharyngeal erythema present.  Cardiovascular:     Rate and Rhythm: Normal rate and regular rhythm.     Heart sounds: Normal heart sounds.  Pulmonary:     Breath sounds: Wheezing present.     Comments: Left upper and lower wheezing. Abdominal:     Palpations: Abdomen is soft.  Musculoskeletal:     Cervical back: Normal range of motion and neck supple.  Skin:    General: Skin is warm.  Neurological:     Mental Status: She is alert and oriented to person, place, and time.      UC Treatments / Results  Labs (all labs ordered are listed, but only  abnormal results are displayed) Labs Reviewed - No data to display  EKG   Radiology No results found.  Procedures Procedures (including critical care time)  Medications Ordered in UC Medications - No data to display  Initial Impression / Assessment and Plan / UC Course  I have reviewed the triage vital signs and the nursing notes.  Pertinent labs & imaging results that were available during my care of the patient were reviewed by me and considered in my medical decision making (see chart for details).   Patient is presenting with continued viral symptoms but having worsening shortness of breath and productive cough.  Symptoms are consistent with viral bronchitis.  Albuterol and prednisone recommend Mucinex twice a day. Since she continues to have congestion with serous fluid noted bilateral ears and recommending Sudafed.  She can take 30 mg 4 times daily if needed.   Final Clinical Impressions(s) / UC Diagnoses   Final diagnoses:  Acute bronchitis, unspecified organism     Discharge Instructions      Albuterol 1 to 2 puffs every 4 hours as needed for shortness of breath. Mucinex 600 mg 1 tablet twice a day as needed Atrovent nasal spray for congestion Sudafed 30 mg tablets 1 tablet every 4 hours as needed for congestion     ED Prescriptions     Medication Sig Dispense Auth. Provider   albuterol (VENTOLIN HFA) 108 (90 Base) MCG/ACT inhaler Inhale 1-2 puffs into the lungs every 4 (four) hours as needed for wheezing or shortness of breath. 18 g Richelle Glick, Linde Gillis, NP   Spacer/Aero-Holding Chambers (COMPACT SPACE CHAMBER) DEVI Use with inhaler 1 each Ally Knodel, Linde Gillis, NP   guaiFENesin (MUCINEX) 600 MG 12 hr tablet Take 1 tablet (600 mg total) by mouth 2 (two) times daily. 30 tablet Sophea Rackham, Linde Gillis, NP   pseudoephedrine (SUDAFED) 60 MG tablet Take 1 tablet (60 mg total) by mouth every 8 (eight) hours as needed for congestion. 30 tablet Kevina Piloto M, NP   ipratropium  (ATROVENT) 0.03 % nasal spray Place 2 sprays into both nostrils every 12 (twelve) hours. 30 mL Toriana Sponsel M, NP   predniSONE (DELTASONE) 10 MG tablet 2 tabs for 4 days and then 1 tab for 2 days 10 tablet Shanzay Hepworth, Linde Gillis, NP      PDMP not reviewed this encounter.   Nelda Marseille, NP 02/03/23 1431

## 2023-02-03 NOTE — Discharge Instructions (Addendum)
Albuterol 1 to 2 puffs every 4 hours as needed for shortness of breath. Prednisone 2 tabs for 4 days and 1 tab for 2 days Mucinex 600 mg 1 tablet twice a day as needed Atrovent nasal spray for congestion Sudafed 30 mg tablets 1 tablet every 4 hours as needed for congestion

## 2023-02-15 ENCOUNTER — Ambulatory Visit (INDEPENDENT_AMBULATORY_CARE_PROVIDER_SITE_OTHER): Payer: Medicaid Other

## 2023-02-15 ENCOUNTER — Other Ambulatory Visit: Payer: Self-pay

## 2023-02-15 ENCOUNTER — Encounter (HOSPITAL_COMMUNITY): Payer: Self-pay | Admitting: *Deleted

## 2023-02-15 ENCOUNTER — Ambulatory Visit (HOSPITAL_COMMUNITY)
Admission: EM | Admit: 2023-02-15 | Discharge: 2023-02-15 | Disposition: A | Discharge status: Home / Self Care | Payer: Medicaid Other | Attending: Emergency Medicine | Admitting: Emergency Medicine

## 2023-02-15 DIAGNOSIS — R051 Acute cough: Secondary | ICD-10-CM | POA: Diagnosis not present

## 2023-02-15 DIAGNOSIS — R0602 Shortness of breath: Secondary | ICD-10-CM

## 2023-02-15 DIAGNOSIS — J069 Acute upper respiratory infection, unspecified: Secondary | ICD-10-CM

## 2023-02-15 MED ORDER — KETOROLAC TROMETHAMINE 30 MG/ML IJ SOLN
INTRAMUSCULAR | Status: AC
  Filled 2023-02-15: qty 1

## 2023-02-15 MED ORDER — KETOROLAC TROMETHAMINE 30 MG/ML IJ SOLN
30.0000 mg | Freq: Once | INTRAMUSCULAR | Status: AC
  Administered 2023-02-15: 30 mg via INTRAMUSCULAR

## 2023-02-15 MED ORDER — PREDNISONE 20 MG PO TABS: 40.0000 mg | ORAL_TABLET | Freq: Every day | ORAL | 0 refills | Status: DC

## 2023-02-15 MED ORDER — AMOXICILLIN-POT CLAVULANATE 875-125 MG PO TABS: 1.0000 | ORAL_TABLET | Freq: Two times a day (BID) | ORAL | 0 refills | Status: DC

## 2023-02-15 NOTE — ED Triage Notes (Signed)
Pt returns with ongoing cough. Pt reports cough has not improved and she is wheezing . Pt needs a refill on inhaler  was treated with prednisone on last visit.

## 2023-02-15 NOTE — ED Provider Notes (Signed)
[facility]    CSN: [id number] Arrival date & time: 02/15/23  0802      History   Chief Complaint Chief Complaint  Patient presents with   Cough    HPI Natasha Garcia is a 32 y.o. female.   Patient presents to clinic with ongoing productive cough.  She was seen on 10/24 and 10/27 for symptoms that started on 10/22.  Was treated for a viral URI, given prednisone and albuterol inhaler.  She has been using the albuterol inhaler daily and is almost out of it.  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  She is coughing up dark mucus.  Reports chest and headaches from the coughing.  Sore throat from coughing.  Gets fatigued and lightheaded with her coughing fits.  She did use albuterol prior to arrival today.  Initially when her symptoms started she had sinus pressure and congestion, no more sinus pressure, feels like it is moved into her chest.  She denies fevers.  No sinus pain or pressure.     The history is provided by the patient and medical records.  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 Authorizing Provider  amoxicillin-clavulanate (AUGMENTIN) 875-125 MG tablet Take 1 tablet by mouth every 12 (twelve) hours. 02/15/23  Yes Rinaldo Ratel, [place] N, FNP  cetirizine (ZYRTEC ALLERGY) 10 MG tablet Take 1 tablet (10 mg total) by mouth daily. 01/31/23  Yes Rising, Rebecca, PA-C  fluticasone (FLONASE) 50 MCG/ACT nasal spray Place 2 sprays into both nostrils daily. 01/31/23  Yes Rising, Lurena Joiner, PA-C  predniSONE (DELTASONE) 20 MG tablet Take 2 tablets (40 mg total) by mouth daily for 5 days. 02/15/23 02/20/23 Yes Rinaldo Ratel, [place] N, FNP  albuterol (VENTOLIN HFA) 108 (90 Base) MCG/ACT inhaler Inhale 1-2 puffs into the lungs every 4 (four) hours as needed for wheezing or shortness of breath. 02/03/23   Blitch, Linde Gillis, NP  guaiFENesin (MUCINEX) 600 MG 12 hr tablet Take 1 tablet (600 mg total) by mouth 2 (two) times daily. 02/03/23   Blitch, Linde Gillis, NP  ipratropium (ATROVENT) 0.03 % nasal spray Place 2 sprays into  both nostrils every 12 (twelve) hours. 02/03/23   Blitch, Linde Gillis, NP  pseudoephedrine (SUDAFED) 60 MG tablet Take 1 tablet (60 mg total) by mouth every 8 (eight) hours as needed for congestion. 02/03/23   Blitch, Linde Gillis, NP  Spacer/Aero-Holding Chambers (COMPACT SPACE CHAMBER) DEVI Use with inhaler 02/03/23   Blitch, Linde Gillis, NP  valACYclovir (VALTREX) 1000 MG tablet TAKE 1 TABLET (1,000 MG TOTAL) BY MOUTH DAILY. FOR 5 DAYS AT START OF OUTBREAK 12/04/21   Latrelle Dodrill, MD    Family History Family History  Problem Relation Age of Onset   Cancer Maternal Grandmother    Diabetes Neg Hx    Heart disease Neg Hx    Stroke Neg Hx    Anesthesia problems Neg Hx     Social History Social History   Tobacco Use   Smoking status: Never   Smokeless tobacco: Never  Vaping Use   Vaping status: Never Used   Substance Use Topics   Alcohol use: Yes    Comment: occasionally   Drug use: Yes    Types: Marijuana     Allergies   Patient has no known allergies.   Review of Systems Review of Systems  Per HPI   Physical Exam Triage Vital Signs ED Triage Vitals  Encounter Vitals Group     BP 02/15/23 0823 116/78     Systolic BP Percentile --      Diastolic BP Percentile --      Pulse Rate 02/15/23 0823 (!) 106     Resp 02/15/23 0823 20     Temp 02/15/23 0823 99 F (37.2 C)     Temp src --      SpO2 02/15/23 0823 97 %     Weight --      Height --      Head Circumference --      Peak Flow --      Pain Score 02/15/23 0820 8     Pain Loc --      Pain Education --      Exclude from Growth Chart --    No data found.  Updated Vital Signs BP 116/78   Pulse (!) 106   Temp 99 F (37.2 C)   Resp 20   LMP 02/15/2023 (Approximate)   SpO2 97%   Visual Acuity Right Eye Distance:   Left Eye Distance:   Bilateral Distance:    Right Eye Near:   Left Eye Near:    Bilateral Near:     Physical Exam Vitals and nursing note reviewed.  Constitutional:      Appearance: Normal appearance.  HENT:     Head: Normocephalic and atraumatic.     Right Ear: External ear normal.     Left Ear: External ear normal.     Nose: Congestion present.     Mouth/Throat:     Mouth: Mucous membranes are moist.     Pharynx: Posterior oropharyngeal erythema present.  Eyes:     Conjunctiva/sclera: Conjunctivae normal.  Cardiovascular:     Rate and Rhythm: Normal rate and regular rhythm.     Heart sounds: Normal heart sounds. No murmur heard. Pulmonary:     Effort: Pulmonary effort is normal. No respiratory distress.     Breath sounds: Normal breath sounds.  Musculoskeletal:        General: Normal range of motion.  Skin:    General: Skin is warm and dry.  Neurological:     General: No focal  deficit present.     Mental Status: She is alert and oriented to person, place, and time.  Psychiatric:         Mood and Affect: Mood normal.        Behavior: Behavior normal.      UC Treatments / Results  Labs (all labs ordered are listed, but only abnormal results are displayed) Labs Reviewed - No data to display  EKG   Radiology DG Chest 2 View  Result Date: 02/15/2023 CLINICAL DATA:  Shortness of breath, wheezing. EXAM: CHEST - 2 VIEW COMPARISON:  None Available. FINDINGS: The heart size and mediastinal contours are within normal limits. Both lungs are clear. The visualized skeletal structures are unremarkable. IMPRESSION: No active cardiopulmonary disease. Electronically Signed   By: Lupita Raider M.D.   On: 02/15/2023 08:50    Procedures Procedures (including critical care time)  Medications Ordered in UC Medications  ketorolac (TORADOL) 30 MG/ML injection 30 mg (has no administration in time range)    Initial Impression / Assessment and Plan / UC Course  I have reviewed the triage vital signs and the nursing notes.  Pertinent labs & imaging results that were available during my care of the patient were reviewed by me and considered in my medical decision making (see chart for details).  Vitals and triage reviewed, patient is hemodynamically stable.  Lungs are vesicular, heart with regular rate and rhythm.  Congestion present on exam with posterior pharynx erythema.  Patient requesting Toradol injection for body aches and headache, given by nursing staff.  She has not had any ibuprofen today and educated to not take any today.  Chest x-ray does not show any pneumonia or acute cardiopulmonary processes.  Will treat for sinusitis versus bacterial URI due to duration and symptoms.  Steroid burst sent in for continued wheezing and shortness of breath.  Continue albuterol inhaler use.  Plan of care, follow-up care return precautions given, no questions at this time.     Final Clinical Impressions(s) / UC Diagnoses   Final diagnoses:  Upper respiratory tract infection,  unspecified type  Acute cough     Discharge Instructions      Your chest x-ray did not show any pneumonia.  Please take all antibiotics as prescribed and until finished, you can take them with food to prevent gastrointestinal upset.  Start the steroids today with breakfast and take these for the next 5 days.  You can sleep with a humidifier at night to help moisten your secretions.  Ensure you are drinking at least 64 ounces of water.  You can alternate between Tylenol and ibuprofen every 4-6 hours for any fever, body aches or chills.  You can continue to use your albuterol as needed for any wheezing or shortness of breath, use this every 4-6 hours.  Your symptoms should improve on antibiotics, if no improvement despite finishing antibiotics or any new symptoms develop please return to clinic.      ED Prescriptions     Medication Sig Dispense Auth. Provider   predniSONE (DELTASONE) 20 MG tablet Take 2 tablets (40 mg total) by mouth daily for 5 days. 10 tablet Rinaldo Ratel, [place] N, FNP   amoxicillin-clavulanate (AUGMENTIN) 875-125 MG tablet Take 1 tablet by mouth every 12 (twelve) hours. 14 tablet Chet Greenley, [place] N, [place]      PDMP not reviewed this encounter.   Kathya Wilz, [place] N, [place] 02/15/23 0900

## 2023-02-15 NOTE — Discharge Instructions (Signed)
Your chest x-ray did not show any pneumonia.  Please take all antibiotics as prescribed and until finished, you can take them with food to prevent gastrointestinal upset.  Start the steroids today with breakfast and take these for the next 5 days.  You can sleep with a humidifier at night to help moisten your secretions.  Ensure you are drinking at least 64 ounces of water.  You can alternate between Tylenol and ibuprofen every 4-6 hours for any fever, body aches or chills.  You can continue to use your albuterol as needed for any wheezing or shortness of breath, use this every 4-6 hours.  Your symptoms should improve on antibiotics, if no improvement despite finishing antibiotics or any new symptoms develop please return to clinic.

## 2023-02-18 ENCOUNTER — Telehealth (HOSPITAL_COMMUNITY): Payer: Self-pay | Admitting: *Deleted

## 2023-02-18 ENCOUNTER — Telehealth (HOSPITAL_COMMUNITY): Payer: Self-pay

## 2023-02-18 ENCOUNTER — Encounter (HOSPITAL_COMMUNITY): Payer: Self-pay | Admitting: *Deleted

## 2023-02-18 ENCOUNTER — Other Ambulatory Visit: Payer: Self-pay

## 2023-02-18 ENCOUNTER — Ambulatory Visit (INDEPENDENT_AMBULATORY_CARE_PROVIDER_SITE_OTHER): Payer: Medicaid Other

## 2023-02-18 ENCOUNTER — Ambulatory Visit (HOSPITAL_COMMUNITY)
Admission: EM | Admit: 2023-02-18 | Discharge: 2023-02-18 | Disposition: A | Discharge status: Home / Self Care | Payer: Medicaid Other | Attending: Family Medicine | Admitting: Family Medicine

## 2023-02-18 DIAGNOSIS — J4541 Moderate persistent asthma with (acute) exacerbation: Secondary | ICD-10-CM | POA: Diagnosis not present

## 2023-02-18 MED ORDER — GUAIFENESIN-CODEINE 100-10 MG/5ML PO SYRP: 5.0000 mL | ORAL_SOLUTION | Freq: Four times a day (QID) | ORAL | 0 refills | Status: DC | PRN

## 2023-02-18 MED ORDER — FLUCONAZOLE 150 MG PO TABS: 150.0000 mg | ORAL_TABLET | ORAL | 0 refills | Status: AC

## 2023-02-18 MED ORDER — FLUTICASONE PROPIONATE HFA 110 MCG/ACT IN AERO: 2.0000 | INHALATION_SPRAY | Freq: Two times a day (BID) | RESPIRATORY_TRACT | 0 refills | Status: DC

## 2023-02-18 MED ORDER — ALBUTEROL SULFATE (2.5 MG/3ML) 0.083% IN NEBU
INHALATION_SOLUTION | RESPIRATORY_TRACT | Status: AC
  Filled 2023-02-18: qty 3

## 2023-02-18 MED ORDER — DOXYCYCLINE HYCLATE 100 MG PO CAPS: 100.0000 mg | ORAL_CAPSULE | Freq: Two times a day (BID) | ORAL | 0 refills | Status: AC

## 2023-02-18 MED ORDER — ALBUTEROL SULFATE HFA 108 (90 BASE) MCG/ACT IN AERS: 2.0000 | INHALATION_SPRAY | RESPIRATORY_TRACT | 0 refills | Status: DC | PRN

## 2023-02-18 MED ORDER — ALBUTEROL SULFATE (2.5 MG/3ML) 0.083% IN NEBU
2.5000 mg | INHALATION_SOLUTION | Freq: Once | RESPIRATORY_TRACT | Status: AC
  Administered 2023-02-18: 2.5 mg via RESPIRATORY_TRACT

## 2023-02-18 MED ORDER — BENZONATATE 100 MG PO CAPS: 100.0000 mg | ORAL_CAPSULE | Freq: Three times a day (TID) | ORAL | 0 refills | Status: DC | PRN

## 2023-02-18 NOTE — Discharge Instructions (Addendum)
Your chest x-ray may show a developing pneumonia.  The radiologist will also read your x-ray, and if their interpretation differs significantly from mine, we will call you.  Albuterol inhaler--do 2 puffs every 4 hours as needed for shortness of breath or wheezing  Flovent inhaler--2 inhalations 2 times daily; this is a steroid inhaler.  Take doxycycline 100 mg --1 capsule 2 times daily for 7 days.  This is the antibiotic.  Stop taking amoxicillin-clavulanate.   Take benzonatate 100 mg, 1 tab every 8 hours as needed for cough.  Robitussin with codeine cough syrup--take 5 mL or 1 teaspoon every 6 hours as needed for cough.  Please follow-up with your primary care.

## 2023-02-18 NOTE — ED Triage Notes (Signed)
Pt continues to have a cough for 2 weeks. Pt is requesting real cough syrup.

## 2023-02-18 NOTE — ED Provider Notes (Addendum)
[facility]    CSN: [id number] Arrival date & time: 02/18/23  0807      History   Chief Complaint Chief Complaint  Patient presents with   Cough   Wheezing    HPI Natasha Garcia is a 32 y.o. female.    Cough Associated symptoms: wheezing   Wheezing Associated symptoms: cough   Here for continued wheezing and worsening cough.  She has been sick for about 3 weeks now.  She has been seen here on October 24, Oct twenty- seventh, and November 8.  Initially testing for COVID and flu were negative.  She has been prescribed albuterol and prednisone for asthma exacerbation.  She states that the albuterol inhaler has helped when she uses it but she is out.  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  She does not feel that the prednisone course has helped her have less trouble.  When she was seen on November 8 her chest x-ray was negative.  She was prescribed Augmentin and another course of prednisone.  The coughing is more persistent and she also feels her throat is swollen and painful some. She has had some posttussive emesis with hard coughing spells.  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 Authorizing Provider  albuterol (VENTOLIN HFA) 108 (90 Base) MCG/ACT inhaler Inhale 2 puffs into the lungs every 4 (four) hours as needed for wheezing or shortness of breath. 02/18/23  Yes Zenia Resides, MD  benzonatate (TESSALON) 100 MG capsule Take 1 capsule (100 mg total) by mouth 3 (three) times daily as needed for cough. 02/18/23  Yes Zenia Resides, MD  doxycycline (VIBRAMYCIN) 100 MG capsule Take 1 capsule (100 mg total) by mouth 2 (two) times daily for 7 days. 02/18/23 02/25/23 Yes Zenia Resides, MD  fluconazole (DIFLUCAN) 150 MG tablet Take 1 tablet (150 mg total) by mouth every 3 (three) days for 2 doses. 02/18/23 02/22/23 Yes Zenia Resides, MD  fluticasone (FLOVENT HFA) 110 MCG/ACT inhaler Inhale 2 puffs into the lungs in the morning and at bedtime. 02/18/23  Yes  Zenia Resides, MD  guaiFENesin-codeine [facility]) 100-10 MG/5ML syrup Take 5 mLs by mouth 4 (four) times daily as needed for cough. 02/18/23  Yes Zenia Resides, MD  Spacer/Aero-Holding Chambers (COMPACT SPACE CHAMBER) DEVI Use with inhaler 02/03/23   Nelda Marseille, NP  valACYclovir (VALTREX) 1000 MG tablet TAKE 1 TABLET (1,000 MG TOTAL) BY MOUTH DAILY. FOR 5 DAYS AT START OF OUTBREAK 12/04/21   Latrelle Dodrill, MD    Family History Family History  Problem Relation Age of Onset   Cancer Maternal Grandmother    Diabetes Neg Hx    Heart disease Neg Hx    Stroke Neg Hx    Anesthesia problems Neg Hx     Social History Social History   Tobacco Use   Smoking status: Never   Smokeless tobacco: Never  Vaping Use   Vaping status: Never Used  Substance Use Topics   Alcohol use: Yes     Comment: occasionally   Drug use: Yes    Types: Marijuana     Allergies   Patient has no known allergies.   Review of Systems Review of Systems  Respiratory:  Positive for cough and wheezing.      Physical Exam Triage Vital Signs ED Triage Vitals  Encounter Vitals Group     BP 02/18/23 0831 111/76     Systolic BP Percentile --      Diastolic BP Percentile --      Pulse Rate 02/18/23 0831 89     Resp 02/18/23 0831 18     Temp 02/18/23 0831 98.3 F (36.8 C)     Temp src --      SpO2 02/18/23 0831 98 %     Weight --      Height --      Head Circumference --      Peak Flow --      Pain Score 02/18/23 0828 8     Pain Loc --      Pain Education --      Exclude from Growth Chart --    No data found.  Updated Vital Signs BP 111/76   Pulse 89   Temp 98.3 F (36.8 C)   Resp 18   LMP 02/15/2023 (Approximate)   SpO2 98%   Visual Acuity Right Eye Distance:   Left Eye Distance:   Bilateral Distance:    Right Eye Near:   Left Eye Near:    Bilateral Near:     Physical Exam Vitals reviewed.  Constitutional:      General: She is not in acute distress.    Appearance: She is not ill-appearing, toxic-appearing or diaphoretic.     Comments: No respiratory distress.  She is coughing persistently which makes it difficult for her to speak.  HENT:     Right Ear: Tympanic membrane and ear canal normal.     Left Ear: Tympanic membrane and ear canal normal.     Nose: Nose normal.     Mouth/Throat:     Mouth: Mucous membranes are moist.     Pharynx: No oropharyngeal exudate or posterior oropharyngeal erythema.     Comments: There is no erythema or swelling or asymmetry of the oropharynx. Eyes:     Extraocular Movements: Extraocular movements intact.     Conjunctiva/sclera: Conjunctivae normal.     Pupils: Pupils are equal, round, and reactive to light.  Cardiovascular:     Rate and Rhythm: Normal rate and regular rhythm.  Heart sounds: No murmur heard. Pulmonary:      Effort: No respiratory distress.     Breath sounds: No stridor. No wheezing, rhonchi or rales.     Comments: I do not hear wheezing on exam.  Expiratory phase is normal. Chest:     Chest wall: No tenderness.  Musculoskeletal:     Cervical back: Neck supple.  Lymphadenopathy:     Cervical: No cervical adenopathy.  Skin:    Capillary Refill: Capillary refill takes less than 2 seconds.     Coloration: Skin is not jaundiced or pale.  Neurological:     General: No focal deficit present.     Mental Status: She is alert and oriented to person, place, and time.  Psychiatric:        Behavior: Behavior normal.      UC Treatments / Results  Labs (all labs ordered are listed, but only abnormal results are displayed) Labs Reviewed - No data to display  EKG   Radiology DG Chest 2 View  Result Date: 02/18/2023 CLINICAL DATA:  Worsening cough for 2-3 weeks. EXAM: CHEST - 2 VIEW COMPARISON:  02/15/2023 FINDINGS: The heart size and mediastinal contours are within normal limits. Both lungs are clear. The visualized skeletal structures are unremarkable. IMPRESSION: No active cardiopulmonary disease. Electronically Signed   By: Danae Orleans M.D.   On: 02/18/2023 11:00    Procedures Procedures (including critical care time)  Medications Ordered in UC Medications  albuterol (PROVENTIL) (2.5 MG/3ML) 0.083% nebulizer solution 2.5 mg (2.5 mg Nebulization Given 02/18/23 0902)    Initial Impression / Assessment and Plan / UC Course  I have reviewed the triage vital signs and the nursing notes.  Pertinent labs & imaging results that were available during my care of the patient were reviewed by me and considered in my medical decision making (see chart for details).    The albuterol nebulizer did help her feel better and less in the coughing, though she continued to cough  I reviewed the chest x-ray possibly shows some new infiltrate forming right hilar region.  She is advised of radiology  overread.  She has been taking Augmentin, so I am going to change her to doxycycline to cover atypicals.  We have had a lot of mycoplasma in the community.  Albuterol is sent in and Flovent is sent in to treat her asthma apparently he is now flaring.  Since she has had 2 rounds of prednisone, I am not going to do that anymore.  Tessalon Perles were sent in for daytime use and codeine cough syrup are sent in  I have asked her to follow-up with her primary care this issue as she may need adjustment of the daily controller medication.  Fluconazole sent in as she gets yeast infections often with antibiotics. Final Clinical Impressions(s) / UC Diagnoses   Final diagnoses:  Moderate persistent asthma with exacerbation     Discharge Instructions      Your chest x-ray may show a developing pneumonia.  The radiologist will also read your x-ray, and if their interpretation differs significantly from mine, we will call you.  Albuterol inhaler--do 2 puffs every 4 hours as needed for shortness of breath or wheezing  Flovent inhaler--2 inhalations 2 times daily; this is a steroid inhaler.  Take doxycycline 100 mg --1 capsule 2 times daily for 7 days.  This is the antibiotic.  Stop taking amoxicillin-clavulanate.   Take benzonatate 100 mg, 1 tab every 8 hours as needed for  cough.  Robitussin with codeine cough syrup--take 5 mL or 1 teaspoon every 6 hours as needed for cough.  Please follow-up with your primary care.     ED Prescriptions     Medication Sig Dispense Auth. Provider   albuterol (VENTOLIN HFA) 108 (90 Base) MCG/ACT inhaler Inhale 2 puffs into the lungs every 4 (four) hours as needed for wheezing or shortness of breath. 1 each Zenia Resides, MD   fluticasone (FLOVENT HFA) 110 MCG/ACT inhaler Inhale 2 puffs into the lungs in the morning and at bedtime. 1 each Zenia Resides, MD   doxycycline (VIBRAMYCIN) 100 MG capsule Take 1 capsule (100 mg total) by mouth 2 (two)  times daily for 7 days. 14 capsule Ameka Krigbaum, Janace Aris, MD   benzonatate (TESSALON) 100 MG capsule Take 1 capsule (100 mg total) by mouth 3 (three) times daily as needed for cough. 21 capsule Klarisa Barman, Janace Aris, MD   guaiFENesin-codeine [facility]) 100-10 MG/5ML syrup Take 5 mLs by mouth 4 (four) times daily as needed for cough. 120 mL Zenia Resides, MD   fluconazole (DIFLUCAN) 150 MG tablet Take 1 tablet (150 mg total) by mouth every 3 (three) days for 2 doses. 2 tablet Marlinda Mike Janace Aris, MD      I have reviewed the PDMP during this encounter.   Zenia Resides, MD 02/18/23 [phone]    Zenia Resides, MD 02/18/23 6213    Zenia Resides, MD 02/18/23 [phone]

## 2023-04-25 ENCOUNTER — Other Ambulatory Visit: Payer: Self-pay | Admitting: Family Medicine

## 2023-05-13 ENCOUNTER — Ambulatory Visit: Payer: Medicaid Other | Admitting: Family Medicine

## 2023-05-16 ENCOUNTER — Other Ambulatory Visit: Payer: Self-pay | Admitting: Family Medicine

## 2023-05-20 NOTE — Telephone Encounter (Signed)
 Please let patient know I am refilling this medication, but she needs to schedule and attend an appointment with me - no showed to the appointment she previously scheduled I will not refill again until she is seen.  Thanks, Candee Cha, MD

## 2024-01-29 ENCOUNTER — Telehealth: Payer: Self-pay | Admitting: Family Medicine

## 2024-01-29 ENCOUNTER — Other Ambulatory Visit: Payer: Self-pay | Admitting: Family Medicine

## 2024-01-29 NOTE — Telephone Encounter (Signed)
**  After Hours/ Emergency Line Call**  Received a page to call [phone] Patient: Natasha Garcia  Caller: Self  Confirmed name & DOB of patient with caller  Subjective:  Patient called after-hours number but did not realize it was in the emergency line.  Reports she is having an HSV outbreak and is out of medication.  Per review of last PCP refill, noted that patient will not receive any other refills until she is seen in the clinic.  Offered to schedule patient an appointment tomorrow.  Objective:  Observations: NAD, speaking in full sentences  Assessment & Plan  JOLINA SYMONDS is a 33 y.o. female with concern of HSV outbreak.  Patient scheduled at preferred appointment at 10/23 at 11 AM.  Will forward to physician seeing the patient.   Izetta Nap, MD [facility], PGY-3

## 2024-01-30 ENCOUNTER — Ambulatory Visit: Payer: Self-pay

## 2024-02-03 ENCOUNTER — Ambulatory Visit: Admitting: Family Medicine

## 2024-02-06 ENCOUNTER — Ambulatory Visit (INDEPENDENT_AMBULATORY_CARE_PROVIDER_SITE_OTHER)

## 2024-02-06 ENCOUNTER — Other Ambulatory Visit (HOSPITAL_COMMUNITY)
Admission: RE | Admit: 2024-02-06 | Discharge: 2024-02-06 | Disposition: A | Discharge status: Home / Self Care | Source: Ambulatory Visit | Attending: Family Medicine | Admitting: Family Medicine

## 2024-02-06 ENCOUNTER — Ambulatory Visit: Admitting: Family Medicine

## 2024-02-06 VITALS — BP 125/82 | HR 84 | Ht 66.0 in | Wt 160.0 lb

## 2024-02-06 DIAGNOSIS — B009 Herpesviral infection, unspecified: Secondary | ICD-10-CM | POA: Diagnosis present

## 2024-02-06 DIAGNOSIS — Z124 Encounter for screening for malignant neoplasm of cervix: Secondary | ICD-10-CM | POA: Insufficient documentation

## 2024-02-06 DIAGNOSIS — R062 Wheezing: Secondary | ICD-10-CM | POA: Diagnosis not present

## 2024-02-06 DIAGNOSIS — Z113 Encounter for screening for infections with a predominantly sexual mode of transmission: Secondary | ICD-10-CM | POA: Diagnosis not present

## 2024-02-06 DIAGNOSIS — R5383 Other fatigue: Secondary | ICD-10-CM

## 2024-02-06 MED ORDER — VALACYCLOVIR HCL 1 G PO TABS: 1000.0000 mg | ORAL_TABLET | Freq: Every day | ORAL | 0 refills | Status: DC

## 2024-02-06 MED ORDER — VALACYCLOVIR HCL 1 G PO TABS: 1000.0000 mg | ORAL_TABLET | Freq: Every day | ORAL | 3 refills | Status: AC

## 2024-02-06 NOTE — Progress Notes (Signed)
    SUBJECTIVE:   CHIEF COMPLAINT / HPI:   Natasha Garcia is a 33 y.o. female presenting for follow-up of recent HSV flare.  HSV Flare Reports flare earlier this month, for which she used a friend's Valtrex . Reports now resolved. Has a flare ~every 6 months, managed with Valtrex . No current symptoms of vaginal itching, pain, discharge.  Wheezing Reports occasional wheezing, not necessarily shortness of breath. Has been happening since bad viral infection a year ago. Does have a family history of asthma in multiple members on her mom's side; her daughter also has asthma. Tobacco use: None Other: 1 marijuana blunt a day; no other recreational drug use Alcohol use: 1-2 times a week, 1-2 cans of margarita at a time  Fatigue Reports fatigue ongoing for months. 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  Healthcare Maintenance: - Flu vaccine - got 10/28 - COVID-vaccine - not interested - Pap smear - done today  PERTINENT  PMH / PSH: HSV2 infection  OBJECTIVE:   BP 125/82   Pulse 84   Ht 5' 6 (1.676 m)   Wt 160 lb (72.6 kg)   SpO2 99%   BMI 25.82 kg/m    Respiratory: Normal work of breathing on room air. Clear to auscultation bilaterally; no wheezes, crackles. Pelvic: Normal external appearance of vagina, normal internal appearance of vagina, cervix; thin white discharge present from cervix. Pap performed  Chaperone: Julee Mary  ASSESSMENT/PLAN:   Assessment & Plan HSV-2 infection Recent infection, since resolved. No sign of infection on pelvic exam today - Valtrex  refilled Pap smear for cervical cancer screening Routine screening for STI (sexually transmitted infection) - Pap smear with cotesting performed - Labs: HIV, Hep C, RPR Wheezing Patient reports intermittent wheezing over past year. - Referral placed to Dr. Koval for PFTs; patient unsure of schedule for this appointment, she will plan to call  back to schedule Other fatigue Hx of anemia while pregnant. - Labs: CBC to check for anemia   Alan Flies, MD [facility]

## 2024-02-06 NOTE — Assessment & Plan Note (Addendum)
 Recent infection, since resolved. No sign of infection on pelvic exam today - Valtrex  refilled

## 2024-02-06 NOTE — Patient Instructions (Addendum)
 Natasha Garcia,   It was great seeing you in clinic today! You came in for your pap smear and to get a refill of your Valtrex . I am also doing some labs on you to check your blood count and look for STI/STD. - Results of labs and Pap smear will be available in MyChart in a few days, and I will call with any abnormal results  I have also placed a referral to Dr. Koval, our pharmacist, to get PFTs (aka lung function test) done to check for asthma.  Please bring all your medications to your next office visit.  Thank you for allowing me to be a part of your care team! Alan Flies, MD [facility] [address] [place], [place], [place] [address] [phone]

## 2024-02-07 ENCOUNTER — Ambulatory Visit: Payer: Self-pay

## 2024-02-07 LAB — CBC WITH DIFFERENTIAL/PLATELET
Basophils Absolute: 0 x10E3/uL (ref 0.0–0.2)
Basos: 1 %
EOS (ABSOLUTE): 0.3 x10E3/uL (ref 0.0–0.4)
Eos: 5 %
Hematocrit: 42.2 % (ref 34.0–46.6)
Hemoglobin: 14.2 g/dL (ref 11.1–15.9)
Immature Grans (Abs): 0 x10E3/uL (ref 0.0–0.1)
Immature Granulocytes: 0 %
Lymphocytes Absolute: 1.7 x10E3/uL (ref 0.7–3.1)
Lymphs: 28 %
MCH: 31.3 pg (ref 26.6–33.0)
MCHC: 33.6 g/dL (ref 31.5–35.7)
MCV: 93 fL (ref 79–97)
Monocytes Absolute: 0.4 x10E3/uL (ref 0.1–0.9)
Monocytes: 7 %
Neutrophils Absolute: 3.6 x10E3/uL (ref 1.4–7.0)
Neutrophils: 59 %
Platelets: 236 x10E3/uL (ref 150–450)
RBC: 4.54 x10E6/uL (ref 3.77–5.28)
RDW: 12 % (ref 11.7–15.4)
WBC: 6 x10E3/uL (ref 3.4–10.8)

## 2024-02-07 LAB — CYTOLOGY - PAP
Chlamydia: NEGATIVE
Comment: NEGATIVE
Comment: NEGATIVE
Comment: NEGATIVE
Comment: NORMAL
Diagnosis: NEGATIVE
High risk HPV: NEGATIVE
Neisseria Gonorrhea: NEGATIVE
Trichomonas: NEGATIVE

## 2024-02-07 LAB — RPR: RPR Ser Ql: NONREACTIVE

## 2024-02-07 LAB — HEPATITIS C ANTIBODY: Hep C Virus Ab: NONREACTIVE

## 2024-02-07 LAB — HIV ANTIBODY (ROUTINE TESTING W REFLEX): HIV Screen 4th Generation wRfx: NONREACTIVE

## 2024-02-17 ENCOUNTER — Telehealth: Payer: Self-pay | Admitting: Pharmacist

## 2024-02-17 NOTE — Telephone Encounter (Signed)
 Patient contacted for follow-up of scheduling PFT  Phone interaction conducted by Recardo Purdue PharmD - PY4 Candidate   Patient scheduled for PFT on 11/17  Total time with patient call and documentation of interaction: 9 minutes.

## 2024-02-19 NOTE — Telephone Encounter (Signed)
 Reviewed and agree with Dr Rennis plan.

## 2024-02-24 ENCOUNTER — Ambulatory Visit (INDEPENDENT_AMBULATORY_CARE_PROVIDER_SITE_OTHER): Admitting: Pharmacist

## 2024-02-24 ENCOUNTER — Encounter: Payer: Self-pay | Admitting: Pharmacist

## 2024-02-24 VITALS — Ht 66.0 in | Wt 163.2 lb

## 2024-02-24 DIAGNOSIS — Z87898 Personal history of other specified conditions: Secondary | ICD-10-CM | POA: Diagnosis present

## 2024-02-24 MED ORDER — ALBUTEROL SULFATE HFA 108 (90 BASE) MCG/ACT IN AERS: 2.0000 | INHALATION_SPRAY | RESPIRATORY_TRACT | 1 refills | Status: DC | PRN

## 2024-02-24 NOTE — Progress Notes (Signed)
 Reviewed and agree with Dr Rennis plan.

## 2024-02-24 NOTE — Assessment & Plan Note (Signed)
 Patient has started experiencing wheezing since last year and taking albuterol  around once per week as needed. She suspected it was RSV infection. Spirometry evaluation with pre-bronchodilator reveals normal lung function. Peak flow reading in-office today is 450. Reported she was prescribed albuterol  a very long time ago but she is not currently having her own inhaler. She has been using albuterol  from her daughter for maybe about ~1x/ week. Endorsed that it helped open up airway every time she uses it.   -Start albuterol  2 puffs as needed for wheezing. Counseled patient that she can take it at night so it can be less activating.   -Provided patient with peak flow meter for her to record breathing pattern and peak flow meter with documentation form for home/personal use.

## 2024-02-24 NOTE — Patient Instructions (Addendum)
 It was nice to see you today! Your breathing result today is normal and possibly better than expected.   Medication Changes: START albuterol  2 puffs as needed for wheezing RECORD your breathing pattern with peak flow rate on the provided asthma action plan   Continue all other medication the same.

## 2024-02-24 NOTE — Progress Notes (Signed)
   S:       Chief Complaint  Patient presents with   Medication Management    PFT    33 y.o. female who presents for PFT evaluation, education, and management. Patient arrives in good spirits and presents without any assistance.   Patient was referred and last seen by Primary Care Provider, Dr. Trevor, on 02/06/2024.    Family History is significant for asthma on mom's side: aunt, mother, daughter   Patient reports breathing has been fluctuating. Most recent wheezing symptoms was around 2 days ago.   Rescue inhaler use frequency: ~1x/week as needed for wheezing O: Review of Systems  All other systems reviewed and are negative.  Physical Exam Constitutional:      Appearance: Normal appearance.  Neurological:     Mental Status: She is alert.  Psychiatric:        Mood and Affect: Mood normal.        Behavior: Behavior normal.        Thought Content: Thought content normal.        Judgment: Judgment normal.   See scanned report or Documentation Flowsheet (discrete results - PFTs) for Spirometry results. Patient provided good effort while attempting spirometry.   Lung Age = 27  Patient is participating in a Managed Medicaid Plan:  Yes  A/P: Patient has started experiencing wheezing since last year and taking albuterol  around once per week as needed. She suspected it was RSV infection. Spirometry evaluation with pre-bronchodilator reveals normal lung function. Peak flow reading in-office today is 450. Reported she was prescribed albuterol  a very long time ago but she is not currently having her own inhaler. She has been using albuterol  from her daughter for maybe about ~1x/ week. Endorsed that it helped open up airway every time she uses it.   -Start albuterol  2 puffs as needed for wheezing. Counseled patient that she can take it at night so it can be less activating.   -Provided patient with peak flow meter for her to record breathing pattern and peak flow meter with  documentation form for home/personal use.   -Follow up in 2 months 04/2024 -Reviewed results of pulmonary function tests. Pt verbalized understanding of results and education.    Written patient instructions provided.   Total time in face to face counseling 30 minutes.    Follow-up:  Pharmacist 04/20/2024 Patient seen with Recardo Purdue PharmD - PY4 Candidate.

## 2024-03-19 ENCOUNTER — Ambulatory Visit (INDEPENDENT_AMBULATORY_CARE_PROVIDER_SITE_OTHER): Admitting: Family Medicine

## 2024-03-19 DIAGNOSIS — Z87898 Personal history of other specified conditions: Secondary | ICD-10-CM

## 2024-03-19 DIAGNOSIS — R051 Acute cough: Secondary | ICD-10-CM | POA: Diagnosis present

## 2024-03-19 MED ORDER — GUAIFENESIN-CODEINE 100-10 MG/5ML PO SOLN: 5.0000 mL | Freq: Four times a day (QID) | ORAL | 0 refills | Status: AC | PRN

## 2024-03-19 MED ORDER — ALBUTEROL SULFATE HFA 108 (90 BASE) MCG/ACT IN AERS: 2.0000 | INHALATION_SPRAY | RESPIRATORY_TRACT | 1 refills | Status: DC | PRN

## 2024-03-19 MED ORDER — BENZONATATE 100 MG PO CAPS: 100.0000 mg | ORAL_CAPSULE | Freq: Three times a day (TID) | ORAL | 0 refills | Status: AC | PRN

## 2024-03-19 NOTE — Progress Notes (Signed)
° ° °  SUBJECTIVE:   CHIEF COMPLAINT / HPI: cough  Discussed the use of AI scribe software for clinical note transcription with the patient, who gave verbal consent to proceed.  History of Present Illness Natasha Garcia is a 33 year old female who presents with  cough.  Respiratory symptoms - Persistent wheezing and cough for approximately 1.5 weeks, onset after Thanksgiving - Chest irritation initially, progressing to constant wheezing - Wheezing worsens when sitting or lying down, especially at night - Dark mucus production without blood - No fever, vomiting, or diarrhea  Response to treatment and medication use - Frequent use of inhaler with only temporary relief; approximately ten puffs remaining - Uses nebulizer at home and work as needed for breathing - Recently completed a course of leftover prednisone , which has now run out. Doesn't feel like this helped much. This hasn't helped in the past.  Symptom recurrence and past episodes - Similar episode last year diagnosed as acute bronchitis - Previous episode improved with benzonatate , codeine  cough syrup  Occupational exposure and infectious risk - Works in a nursing home - Declined COVID and influenza testing today  Recently had PFTs done <32mo ago which revealed normal lung function   PERTINENT  PMH / PSH: hx hsv, wheezing without diagnosis of asthma (normal PFT)  OBJECTIVE:   BP 121/85   Pulse 89   Temp 98.2 F (36.8 C)   Ht 5' 7 (1.702 m)   Wt 168 lb 3.2 oz (76.3 kg)   SpO2 97%   BMI 26.34 kg/m    General: NAD, pleasant, able to participate in exam Cardiac: RRR, no murmurs auscultated Respiratory: CTAB, normal WOB Abdomen: soft, non-tender, non-distended, normoactive bowel sounds Extremities: warm and well perfused, no edema or cyanosis Skin: warm and dry, no rashes noted Neuro: alert, no obvious focal deficits, speech normal Psych: Normal affect and mood  ASSESSMENT/PLAN:     Assessment &  Plan Acute cough No fever or signs of bacterial infection. Normal pulmonary function tests. No wheezing on exam. Vitals stable. Curious if the wheezing is laryngeal. Without diagnosis of asthma or wheezing on exam don't feel that prednisone  would help much - Prescribed benzonatate  and codeine  cough syrup. - Refilled inhaler prescription. - Advised to monitor symptoms and return if fever or signs of bacterial infection develop.   Payton Coward, MD [facility]

## 2024-03-19 NOTE — Patient Instructions (Signed)
°  VISIT SUMMARY: You visited us  today due to persistent wheezing and cough that started after Thanksgiving.  YOUR PLAN: ACUTE COUGH: You have recurrent wheezing and cough without fever or signs of bacterial infection. Your pulmonary function tests are normal. -You have been prescribed benzonatate  and codeine  cough syrup to help with your symptoms. -Your inhaler prescription has been refilled. -Please monitor your symptoms and return if you develop a fever or signs of a bacterial infection.                      Contains text generated by Abridge.                                 Contains text generated by Abridge.

## 2024-03-26 ENCOUNTER — Ambulatory Visit (HOSPITAL_COMMUNITY)
Admission: RE | Admit: 2024-03-26 | Discharge: 2024-03-26 | Disposition: A | Discharge status: Home / Self Care | Source: Ambulatory Visit | Attending: Family Medicine | Admitting: Family Medicine

## 2024-03-26 ENCOUNTER — Encounter (HOSPITAL_COMMUNITY): Payer: Self-pay

## 2024-03-26 ENCOUNTER — Ambulatory Visit (HOSPITAL_COMMUNITY)

## 2024-03-26 VITALS — BP 122/82 | HR 99 | Temp 98.5°F | Resp 20

## 2024-03-26 DIAGNOSIS — R0602 Shortness of breath: Secondary | ICD-10-CM

## 2024-03-26 DIAGNOSIS — J4521 Mild intermittent asthma with (acute) exacerbation: Secondary | ICD-10-CM

## 2024-03-26 MED ORDER — ALBUTEROL SULFATE (2.5 MG/3ML) 0.083% IN NEBU: 2.5000 mg | INHALATION_SOLUTION | Freq: Four times a day (QID) | RESPIRATORY_TRACT | 2 refills | Status: AC | PRN

## 2024-03-26 MED ORDER — PREDNISONE 10 MG (21) PO TBPK: ORAL_TABLET | Freq: Every day | ORAL | 0 refills | Status: AC

## 2024-03-26 MED ORDER — IPRATROPIUM-ALBUTEROL 0.5-2.5 (3) MG/3ML IN SOLN
RESPIRATORY_TRACT | Status: AC
  Filled 2024-03-26: qty 3

## 2024-03-26 MED ORDER — METHYLPREDNISOLONE SODIUM SUCC 125 MG IJ SOLR
125.0000 mg | Freq: Once | INTRAMUSCULAR | Status: AC
  Administered 2024-03-26: 18:00:00 125 mg via INTRAMUSCULAR

## 2024-03-26 MED ORDER — IPRATROPIUM-ALBUTEROL 0.5-2.5 (3) MG/3ML IN SOLN
3.0000 mL | Freq: Once | RESPIRATORY_TRACT | Status: AC
  Administered 2024-03-26: 18:00:00 3 mL via RESPIRATORY_TRACT

## 2024-03-26 MED ORDER — ALBUTEROL SULFATE HFA 108 (90 BASE) MCG/ACT IN AERS: 1.0000 | INHALATION_SPRAY | Freq: Four times a day (QID) | RESPIRATORY_TRACT | 1 refills | Status: AC | PRN

## 2024-03-26 MED ORDER — METHYLPREDNISOLONE SODIUM SUCC 125 MG IJ SOLR
INTRAMUSCULAR | Status: AC
  Filled 2024-03-26: qty 2

## 2024-03-26 NOTE — ED Provider Notes (Signed)
 [facility]   [id number] 03/26/24 Arrival Time: 1623  ASSESSMENT & PLAN:  1. Shortness of breath   2. Mild intermittent asthma with exacerbation    I have personally viewed and independently interpreted the imaging studies ordered this visit. CXR: no acute changes.  Meds ordered this encounter  Medications   predniSONE  (STERAPRED UNI-PAK 21 TAB) 10 MG (21) TBPK tablet    Sig: Take by mouth daily. Take as directed.    Dispense:  21 tablet    Refill:  0   albuterol  (PROVENTIL ) (2.5 MG/3ML) 0.083% nebulizer solution    Sig: Take 3 mLs (2.5 mg total) by nebulization every 6 (six) hours as needed for wheezing or shortness of breath.    Dispense:  75 mL    Refill:  2   albuterol  (VENTOLIN  HFA) 108 (90 Base) MCG/ACT inhaler    Sig: Inhale 1-2 puffs into the lungs every 6 (six) hours as needed for wheezing or shortness of breath.    Dispense:  1 each    Refill:  1   methylPREDNISolone  sodium succinate (SOLU-MEDROL ) 125 mg/2 mL injection 125 mg   ipratropium-albuterol  (DUONEB) 0.5-2.5 (3) MG/3ML nebulizer solution 3 mL   Feeling better after duoneb. Asthma precautions given. OTC symptom care as needed.  Recommend:  Follow-up Information     [facility].   Specialty: Emergency Medicine Why: If symptoms worsen in any way. Contact information: [address] [place]  [address] [phone]                Reviewed expectations re: course of current medical issues. Questions answered. Outlined signs and symptoms indicating need for more acute intervention. Patient verbalized understanding. After Visit Summary given.  SUBJECTIVE: History from: patient.  Natasha Garcia is a 33 y.o. female who presents with complaint of SOB/wheezing; past couple of weeks but much worse over past 24 hours; has gone through two alb inhalers. Affecting sleep. Denies CP. Denies fever. H/O asthma.  Tobacco Use  History[1]  OBJECTIVE:  Vitals:   03/26/24 1643  BP: 122/82  Pulse: 99  Resp: 20  Temp: 98.5 F (36.9 C)  TempSrc: Oral  SpO2: 98%     General appearance: alert; NAD HEENT: [place]; AT; with mild nasal congestion Neck: supple without LAD Cv: RRR without murmer Lungs: unlabored respirations, significant bilateral expiratory wheezing; cough: mild; no significant respiratory distress Skin: warm and dry Psychological: alert and cooperative; normal mood and affect  Imaging: DG Chest 2 View Result Date: 03/26/2024 CLINICAL DATA:  Shortness of breath. EXAM: DG CHEST 2V COMPARISON:  Chest radiograph dated 02/18/2023. FINDINGS: The heart size and mediastinal contours are within normal limits. Both lungs are clear. The visualized skeletal structures are unremarkable. IMPRESSION: No active cardiopulmonary disease. Electronically Signed   By: Vanetta Chou M.D.   On: 03/26/2024 17:34     Allergies[2]  Past Medical History:  Diagnosis Date   Anxiety    Anxiety and depression 01/20/2019   Hx of herpes genitalis    No pertinent past medical history    Family History  Problem Relation Age of Onset   Cancer Maternal Grandmother    Diabetes Neg Hx    Heart disease Neg Hx    Stroke Neg Hx    Anesthesia problems Neg Hx    Social History   Socioeconomic History   Marital status: Single    Spouse name: Not on file   Number of children: Not on file  Years of education: Not on file   Highest education level: Not on file  Occupational History   Occupation: student  Tobacco Use   Smoking status: Never   Smokeless tobacco: Never   Tobacco comments:    02/2024 - smoking 1 blunt per day (mixed tobacco and marijuana)  Vaping Use   Vaping status: Never Used  Substance and Sexual Activity   Alcohol use: Yes    Comment: occasionally   Drug use: Yes    Frequency: 7.0 times per week    Types: Marijuana    Comment: Started using marijuana - age 48,  roles blunts with tobacco - smokes  1 blunt per day 02/2024   Sexual activity: Yes    Birth control/protection: None  Other Topics Concern   Not on file  Social History Narrative   05/10/13 - currently attends [employer], completing prerequisites for RN   Social Drivers of Health   Tobacco Use: Low Risk (03/19/2024)   Patient History    Smoking Tobacco Use: Never    Smokeless Tobacco Use: Never    Passive Exposure: Not on file  Financial Resource Strain: Not on file  Food Insecurity: Not on file  Transportation Needs: Not on file  Physical Activity: Not on file  Stress: Not on file  Social Connections: Unknown (08/15/2021)   Received from [facility]   Social Network    Social Network: Not on file  Intimate Partner Violence: Unknown (07/12/2021)   Received from [facility]   HITS    Physically Hurt: Not on file    Insult or Talk Down To: Not on file    Threaten Physical Harm: Not on file    Scream or Curse: Not on file  Depression (PHQ2-9): Low Risk (03/19/2024)   Depression (PHQ2-9)    PHQ-2 Score: 0  Alcohol Screen: Not on file  Housing: Not on file  Utilities: Not on file  Health Literacy: Not on file              [1]  Social History Tobacco Use  Smoking Status Never  Smokeless Tobacco Never  Tobacco Comments   02/2024 - smoking 1 blunt per day (mixed tobacco and marijuana)  [2] No Known Allergies    Rolinda Rogue, MD 03/26/24 1753

## 2024-03-26 NOTE — Discharge Instructions (Signed)
 Meds ordered this encounter  Medications   predniSONE  (STERAPRED UNI-PAK 21 TAB) 10 MG (21) TBPK tablet    Sig: Take by mouth daily. Take as directed.    Dispense:  21 tablet    Refill:  0   albuterol  (PROVENTIL ) (2.5 MG/3ML) 0.083% nebulizer solution    Sig: Take 3 mLs (2.5 mg total) by nebulization every 6 (six) hours as needed for wheezing or shortness of breath.    Dispense:  75 mL    Refill:  2   albuterol  (VENTOLIN  HFA) 108 (90 Base) MCG/ACT inhaler    Sig: Inhale 1-2 puffs into the lungs every 6 (six) hours as needed for wheezing or shortness of breath.    Dispense:  1 each    Refill:  1   methylPREDNISolone  sodium succinate (SOLU-MEDROL ) 125 mg/2 mL injection 125 mg

## 2024-03-26 NOTE — ED Triage Notes (Signed)
 Pt reports cough and SOB for 2 weeks and used all her inhalers. Pt unsure if has refills on her inhalers. Taking the 2 cough meds, mucinex , breathing treatments and not helping. Took some prednisone  that had from last year.  Saw allergy specialist before these symptoms started.

## 2024-04-20 ENCOUNTER — Ambulatory Visit: Admitting: Pharmacist

## 2024-04-20 ENCOUNTER — Telehealth: Payer: Self-pay | Admitting: Pharmacist

## 2024-04-20 MED ORDER — BUDESONIDE-FORMOTEROL FUMARATE 80-4.5 MCG/ACT IN AERO: 2.0000 | INHALATION_SPRAY | Freq: Two times a day (BID) | RESPIRATORY_TRACT | 3 refills | Status: AC

## 2024-04-20 NOTE — Telephone Encounter (Signed)
 Attempted to contact patient for follow-up of missed appointment and attempt to reevaluate breathing.   Believes she is fairly recovered from bronchitis - Patient reports she is now carrying her albuterol  inhaler now to all places as she fears having an episode of wheezing and shortness of breath.  Feels like her albuterol  is being used too often.   Following discussion, patient willing to start MART therapy with Symbicort  (formoterol  / budesonide ) inhaler 2 puffs BID + PRN Patient educated on purpose, proper use and potential adverse effects of thrush.   Following instruction patient verbalized understanding of treatment plan.   Visit with PCP, Dr. Donah schedule for two weeks to assess control.  Total time with patient call and documentation of interaction: 14 minutes.

## 2024-04-21 NOTE — Telephone Encounter (Signed)
 Reviewed and agree with Dr Rennis plan.

## 2024-05-04 ENCOUNTER — Ambulatory Visit: Admitting: Family Medicine

## 2024-05-21 ENCOUNTER — Ambulatory Visit: Admitting: Family Medicine
